# Patient Record
Sex: Female | Born: 1975 | Race: Black or African American | Hispanic: No | Marital: Single | Smoking: Never smoker
Health system: Southern US, Community
[De-identification: ages and names within clinical notes are randomized; demographics above are authoritative.]

## PROBLEM LIST (undated history)

## (undated) ENCOUNTER — Inpatient Hospital Stay (HOSPITAL_COMMUNITY): Payer: Self-pay

## (undated) DIAGNOSIS — D649 Anemia, unspecified: Secondary | ICD-10-CM

## (undated) DIAGNOSIS — Z789 Other specified health status: Secondary | ICD-10-CM

## (undated) DIAGNOSIS — D219 Benign neoplasm of connective and other soft tissue, unspecified: Secondary | ICD-10-CM

## (undated) DIAGNOSIS — I10 Essential (primary) hypertension: Secondary | ICD-10-CM

## (undated) HISTORY — PX: NO PAST SURGERIES: SHX2092

## (undated) HISTORY — DX: Essential (primary) hypertension: I10

## (undated) HISTORY — DX: Benign neoplasm of connective and other soft tissue, unspecified: D21.9

---

## 2000-04-01 ENCOUNTER — Encounter: Payer: Self-pay | Admitting: Internal Medicine

## 2000-04-01 ENCOUNTER — Emergency Department (HOSPITAL_COMMUNITY): Admission: EM | Admit: 2000-04-01 | Discharge: 2000-04-02 | Payer: Self-pay | Admitting: Internal Medicine

## 2000-05-25 ENCOUNTER — Other Ambulatory Visit: Admission: RE | Admit: 2000-05-25 | Discharge: 2000-05-25 | Payer: Self-pay | Admitting: Obstetrics

## 2000-10-06 ENCOUNTER — Inpatient Hospital Stay (HOSPITAL_COMMUNITY): Admission: AD | Admit: 2000-10-06 | Discharge: 2000-10-06 | Payer: Self-pay | Admitting: Obstetrics

## 2000-10-08 ENCOUNTER — Inpatient Hospital Stay (HOSPITAL_COMMUNITY): Admission: AD | Admit: 2000-10-08 | Discharge: 2000-10-11 | Payer: Self-pay | Admitting: Obstetrics & Gynecology

## 2010-09-04 ENCOUNTER — Ambulatory Visit (HOSPITAL_COMMUNITY): Admission: RE | Admit: 2010-09-04 | Discharge: 2010-09-04 | Payer: Self-pay | Admitting: Obstetrics

## 2010-09-04 ENCOUNTER — Encounter: Payer: Self-pay | Admitting: Obstetrics

## 2010-12-29 ENCOUNTER — Encounter: Payer: Self-pay | Admitting: Obstetrics

## 2011-01-26 ENCOUNTER — Inpatient Hospital Stay (HOSPITAL_COMMUNITY)
Admission: AD | Admit: 2011-01-26 | Discharge: 2011-01-28 | DRG: 775 | Disposition: A | Discharge status: Home / Self Care | Payer: Medicaid Other | Source: Ambulatory Visit | Attending: Obstetrics | Admitting: Obstetrics

## 2011-01-26 DIAGNOSIS — O09529 Supervision of elderly multigravida, unspecified trimester: Principal | ICD-10-CM | POA: Diagnosis present

## 2011-01-26 LAB — CBC
HCT: 35.6 % — ABNORMAL LOW (ref 36.0–46.0)
Hemoglobin: 12.1 g/dL (ref 12.0–15.0)
MCH: 29.4 pg (ref 26.0–34.0)
MCHC: 34 g/dL (ref 30.0–36.0)
MCV: 86.4 fL (ref 78.0–100.0)
Platelets: 184 10*3/uL (ref 150–400)
RBC: 4.12 MIL/uL (ref 3.87–5.11)
RDW: 13.6 % (ref 11.5–15.5)
WBC: 10.9 10*3/uL — ABNORMAL HIGH (ref 4.0–10.5)

## 2011-01-26 LAB — RPR: RPR Ser Ql: NONREACTIVE

## 2011-01-27 LAB — CBC
Platelets: 154 10*3/uL (ref 150–400)
RDW: 14.1 % (ref 11.5–15.5)
WBC: 14.5 10*3/uL — ABNORMAL HIGH (ref 4.0–10.5)

## 2011-04-08 ENCOUNTER — Inpatient Hospital Stay (HOSPITAL_COMMUNITY): Admission: AD | Admit: 2011-04-08 | Payer: Self-pay | Source: Home / Self Care | Admitting: Obstetrics

## 2011-08-12 ENCOUNTER — Other Ambulatory Visit: Payer: Self-pay | Admitting: General Practice

## 2011-08-12 ENCOUNTER — Ambulatory Visit
Admission: RE | Admit: 2011-08-12 | Discharge: 2011-08-12 | Disposition: A | Discharge status: Home / Self Care | Payer: Self-pay | Source: Ambulatory Visit | Attending: General Practice | Admitting: General Practice

## 2011-08-12 DIAGNOSIS — M549 Dorsalgia, unspecified: Secondary | ICD-10-CM

## 2011-08-14 ENCOUNTER — Ambulatory Visit: Payer: Self-pay | Attending: General Practice

## 2011-08-14 DIAGNOSIS — M546 Pain in thoracic spine: Secondary | ICD-10-CM | POA: Insufficient documentation

## 2011-08-14 DIAGNOSIS — IMO0001 Reserved for inherently not codable concepts without codable children: Secondary | ICD-10-CM | POA: Insufficient documentation

## 2011-08-14 DIAGNOSIS — M542 Cervicalgia: Secondary | ICD-10-CM | POA: Insufficient documentation

## 2011-08-14 DIAGNOSIS — M2569 Stiffness of other specified joint, not elsewhere classified: Secondary | ICD-10-CM | POA: Insufficient documentation

## 2011-08-14 DIAGNOSIS — M545 Low back pain, unspecified: Secondary | ICD-10-CM | POA: Insufficient documentation

## 2011-08-14 DIAGNOSIS — M79609 Pain in unspecified limb: Secondary | ICD-10-CM | POA: Insufficient documentation

## 2011-12-09 NOTE — L&D Delivery Note (Signed)
Delivery Note At 11:28 AM a viable female was delivered via Vaginal, Spontaneous Delivery (Presentation: Left Occiput Anterior).  APGAR: 8, 9; weight 4 lb 12.9 oz (2180 g).   Placenta status: , .  Cord: 3 vessels with the following complications: Hyperspiraled.  Cord pH: not done  Anesthesia: Epidural  Episiotomy: None Lacerations: None Suture Repair: 2.0 Est. Blood Loss (mL): 300  Mom to postpartum.  Baby to nursery-stable.  Marissa Lowrey A 07/18/2012, 12:34 PM

## 2012-01-01 ENCOUNTER — Inpatient Hospital Stay (HOSPITAL_COMMUNITY): Payer: Self-pay

## 2012-01-01 ENCOUNTER — Encounter (HOSPITAL_COMMUNITY): Payer: Self-pay | Admitting: *Deleted

## 2012-01-01 ENCOUNTER — Inpatient Hospital Stay (HOSPITAL_COMMUNITY)
Admission: AD | Admit: 2012-01-01 | Discharge: 2012-01-01 | Disposition: A | Discharge status: Home / Self Care | Payer: Self-pay | Source: Ambulatory Visit | Attending: Obstetrics & Gynecology | Admitting: Obstetrics & Gynecology

## 2012-01-01 DIAGNOSIS — O209 Hemorrhage in early pregnancy, unspecified: Secondary | ICD-10-CM | POA: Insufficient documentation

## 2012-01-01 HISTORY — DX: Other specified health status: Z78.9

## 2012-01-01 LAB — WET PREP, GENITAL
Trich, Wet Prep: NONE SEEN
Yeast Wet Prep HPF POC: NONE SEEN

## 2012-01-01 LAB — URINALYSIS, ROUTINE W REFLEX MICROSCOPIC
Bilirubin Urine: NEGATIVE
Nitrite: NEGATIVE
Protein, ur: NEGATIVE mg/dL
Specific Gravity, Urine: 1.01 (ref 1.005–1.030)
Urobilinogen, UA: 0.2 mg/dL (ref 0.0–1.0)

## 2012-01-01 LAB — URINE MICROSCOPIC-ADD ON

## 2012-01-01 LAB — CBC
HCT: 34 % — ABNORMAL LOW (ref 36.0–46.0)
Hemoglobin: 11.3 g/dL — ABNORMAL LOW (ref 12.0–15.0)
MCHC: 33.2 g/dL (ref 30.0–36.0)
RBC: 4.44 MIL/uL (ref 3.87–5.11)

## 2012-01-01 LAB — OB RESULTS CONSOLE ANTIBODY SCREEN: Antibody Screen: NEGATIVE

## 2012-01-01 LAB — OB RESULTS CONSOLE RUBELLA ANTIBODY, IGM: Rubella: IMMUNE

## 2012-01-01 LAB — POCT PREGNANCY, URINE: Preg Test, Ur: POSITIVE — AB

## 2012-01-01 NOTE — Progress Notes (Signed)
Pt in c/o moderate amount of dark blood this morning and abdominal cramping.  LMP 10/31/11.

## 2012-01-01 NOTE — Progress Notes (Signed)
+   preg test women's health on 01/08.  Went to the br this morning and saw dark blood.

## 2012-01-01 NOTE — ED Provider Notes (Signed)
History   Pt presents today c/o dark brown vag bleeding that began this am. She states she had a positive preg test in early January and her LMP was in 10/2011. She denies recent intercourse, vag dc, vag irritation. She does report mild lower abd cramping.  No chief complaint on file.  HPI  OB History    Grav Para Term Preterm Abortions TAB SAB Ect Mult Living   1               No past medical history on file.  No past surgical history on file.  No family history on file.  History  Substance Use Topics  . Smoking status: Not on file  . Smokeless tobacco: Not on file  . Alcohol Use: Not on file    Allergies: No Known Allergies  No prescriptions prior to admission    Review of Systems  Constitutional: Negative for fever.  Eyes: Negative for blurred vision and double vision.  Cardiovascular: Negative for chest pain and palpitations.  Gastrointestinal: Positive for abdominal pain. Negative for nausea, vomiting, diarrhea and constipation.  Genitourinary: Negative for dysuria, urgency, frequency and hematuria.  Neurological: Negative for dizziness and headaches.  Psychiatric/Behavioral: Negative for depression and suicidal ideas.   Physical Exam   Blood pressure 116/63, pulse 70, temperature 98.9 F (37.2 C), temperature source Oral, resp. rate 20, height 5\' 3"  (1.6 m), weight 245 lb (111.131 kg), last menstrual period 10/31/2011, SpO2 99.00%.  Physical Exam  Nursing note and vitals reviewed. Constitutional: She is oriented to person, place, and time. She appears well-developed and well-nourished. No distress.  HENT:  Head: Normocephalic and atraumatic.  Eyes: EOM are normal. Pupils are equal, round, and reactive to light.  GI: Soft. She exhibits no distension and no mass. There is no tenderness. There is no rebound and no guarding.  Genitourinary: There is bleeding around the vagina. No vaginal discharge found.       Cervix Lg/closed. Minimal amount of dark blood  present in the vag vault. No adnexal masses.  Neurological: She is alert and oriented to person, place, and time.  Skin: Skin is warm and dry. She is not diaphoretic.  Psychiatric: She has a normal mood and affect. Her behavior is normal. Judgment and thought content normal.    MAU Course  Procedures  Wet prep and GC/Chlamydia cultures done.  Results for orders placed during the hospital encounter of 01/01/12 (from the past 72 hour(s))  URINALYSIS, ROUTINE W REFLEX MICROSCOPIC     Status: Abnormal   Collection Time   01/01/12 12:00 PM      Component Value Range Comment   Color, Urine YELLOW  YELLOW     APPearance CLEAR  CLEAR     Specific Gravity, Urine 1.010  1.005 - 1.030     pH 8.0  5.0 - 8.0     Glucose, UA NEGATIVE  NEGATIVE (mg/dL)    Hgb urine dipstick MODERATE (*) NEGATIVE     Bilirubin Urine NEGATIVE  NEGATIVE     Ketones, ur NEGATIVE  NEGATIVE (mg/dL)    Protein, ur NEGATIVE  NEGATIVE (mg/dL)    Urobilinogen, UA 0.2  0.0 - 1.0 (mg/dL)    Nitrite NEGATIVE  NEGATIVE     Leukocytes, UA NEGATIVE  NEGATIVE    URINE MICROSCOPIC-ADD ON     Status: Abnormal   Collection Time   01/01/12 12:00 PM      Component Value Range Comment   Squamous Epithelial / LPF FEW (*)  RARE     RBC / HPF 3-6  <3 (RBC/hpf)   POCT PREGNANCY, URINE     Status: Abnormal   Collection Time   01/01/12 12:07 PM      Component Value Range Comment   Preg Test, Ur POSITIVE (*) NEGATIVE    CBC     Status: Abnormal   Collection Time   01/01/12 12:18 PM      Component Value Range Comment   WBC 9.3  4.0 - 10.5 (K/uL)    RBC 4.44  3.87 - 5.11 (MIL/uL)    Hemoglobin 11.3 (*) 12.0 - 15.0 (g/dL)    HCT 16.1 (*) 09.6 - 46.0 (%)    MCV 76.6 (*) 78.0 - 100.0 (fL)    MCH 25.5 (*) 26.0 - 34.0 (pg)    MCHC 33.2  30.0 - 36.0 (g/dL)    RDW 04.5 (*) 40.9 - 15.5 (%)    Platelets 221  150 - 400 (K/uL)   ABO/RH     Status: Normal   Collection Time   01/01/12 12:18 PM      Component Value Range Comment   ABO/RH(D) A  POS     WET PREP, GENITAL     Status: Abnormal   Collection Time   01/01/12 12:25 PM      Component Value Range Comment   Yeast, Wet Prep NONE SEEN  NONE SEEN     Trich, Wet Prep NONE SEEN  NONE SEEN     Clue Cells, Wet Prep NONE SEEN  NONE SEEN     WBC, Wet Prep HPF POC FEW (*) NONE SEEN  MANY BACTERIA SEEN   US shows single living IUP with EGA of 8.6wks and an EDC of 08/06/12. Focal fibroids noted.  Assessment and Plan  Bleeding in preg: discussed with pt at length. Advised no intercourse. She will f/u with her OB provider. Discussed diet, activity, risks, and precautions.  Clinton Gallant. Rice III, DrHSc, MPAS, PA-C  01/01/2012, 12:16 PM   Henrietta Hoover, PA 01/01/12 1336

## 2012-01-02 LAB — GC/CHLAMYDIA PROBE AMP, GENITAL: Chlamydia, DNA Probe: NEGATIVE

## 2012-02-23 ENCOUNTER — Other Ambulatory Visit (HOSPITAL_COMMUNITY): Payer: Self-pay | Admitting: Obstetrics

## 2012-02-23 DIAGNOSIS — O09529 Supervision of elderly multigravida, unspecified trimester: Secondary | ICD-10-CM

## 2012-02-23 DIAGNOSIS — Z3689 Encounter for other specified antenatal screening: Secondary | ICD-10-CM

## 2012-02-25 ENCOUNTER — Encounter (HOSPITAL_COMMUNITY): Payer: Self-pay | Admitting: MS"

## 2012-02-25 ENCOUNTER — Encounter (HOSPITAL_COMMUNITY): Payer: Self-pay | Admitting: Obstetrics

## 2012-03-02 ENCOUNTER — Ambulatory Visit (HOSPITAL_COMMUNITY)
Admission: RE | Admit: 2012-03-02 | Discharge: 2012-03-02 | Disposition: A | Discharge status: Home / Self Care | Payer: Self-pay | Source: Ambulatory Visit | Attending: Obstetrics | Admitting: Obstetrics

## 2012-03-02 ENCOUNTER — Other Ambulatory Visit: Payer: Self-pay

## 2012-03-02 VITALS — BP 111/46 | HR 85 | Wt 255.0 lb

## 2012-03-02 DIAGNOSIS — O09529 Supervision of elderly multigravida, unspecified trimester: Secondary | ICD-10-CM | POA: Insufficient documentation

## 2012-03-02 DIAGNOSIS — Z3689 Encounter for other specified antenatal screening: Secondary | ICD-10-CM

## 2012-03-02 DIAGNOSIS — E669 Obesity, unspecified: Secondary | ICD-10-CM | POA: Insufficient documentation

## 2012-03-02 DIAGNOSIS — Z363 Encounter for antenatal screening for malformations: Secondary | ICD-10-CM | POA: Insufficient documentation

## 2012-03-02 DIAGNOSIS — O3410 Maternal care for benign tumor of corpus uteri, unspecified trimester: Secondary | ICD-10-CM | POA: Insufficient documentation

## 2012-03-02 DIAGNOSIS — Z1389 Encounter for screening for other disorder: Secondary | ICD-10-CM | POA: Insufficient documentation

## 2012-03-02 DIAGNOSIS — O358XX0 Maternal care for other (suspected) fetal abnormality and damage, not applicable or unspecified: Secondary | ICD-10-CM | POA: Insufficient documentation

## 2012-03-02 DIAGNOSIS — O99210 Obesity complicating pregnancy, unspecified trimester: Secondary | ICD-10-CM | POA: Insufficient documentation

## 2012-03-02 LAB — QUAD SCREEN FOR MFM

## 2012-03-02 NOTE — Progress Notes (Signed)
Genetic Counseling  High-Risk Gestation Note  Appointment Date:  03/02/2012 Referred By: Kathreen Cosier, MD Date of Birth:  1976/07/12 Partner:  Natalie Cervantes    Pregnancy History: [id number] Estimated Date of Delivery: 08/06/12 Estimated Gestational Age: [redacted]w[redacted]d Attending: Rica Koyanagi, MD  Natalie Cervantes and her partner, [name], were seen for genetic counseling because of a maternal age of 36 y.o.Marland Kitchen     They were counseled regarding maternal age and the association with risk for chromosome conditions due to nondisjunction with aging of the ova.   We reviewed chromosomes, nondisjunction, and the associated 1 in 54 risk for fetal aneuploidy at [redacted]w[redacted]d related to a maternal age of 36 y.o. at delivery.  They were counseled that the risk for aneuploidy decreases as gestational age increases, accounting for those pregnancies which spontaneously abort.  We specifically discussed Down syndrome (trisomy 21), trisomies 13 and 47, including the common features and prognoses of each.   We reviewed available screening and diagnostic options.  Regarding screening tests, we discussed the options of Quad screen and ultrasound.  They understand that screening tests are used to modify a patient's a priori risk for aneuploidy, typically based on age.  This estimate provides a pregnancy specific risk assessment. We discussed another type of screening test, noninvasive prenatal testing (NIPT), which utilizes cell free fetal DNA found in the maternal circulation. This test is not diagnostic for chromosome conditions, but can provide information regarding the presence or absence of extra fetal DNA for chromosomes 13, 18 and 21. Thus, it would not identify or rule out all fetal aneuploidy. The reported detection rate is greater than 99% for Trisomy 21, greater than 97% for Trisomy 18, and is approximately 80% (8 out of 10) for Trisomy 13. The false positive rate is reported to be less than 1% for any of  these conditions. We also reviewed the availability of diagnostic option of amniocentesis.  We discussed the risks, limitations, and benefits of each.  After reviewing these options, Natalie Cervantes elected to have Quad screening and ultrasound, but declined amniocentesis and cell free fetal DNA testing ([facility]).   She understands that ultrasound cannot rule out all birth defects or genetic syndromes. The patient was advised of this limitation and states she still does not want diagnostic testing at this time.  However, they were counseled that 50-80% of fetuses with Down syndrome and up to 90% of fetuses with trisomies 13 and 18, when well visualized, have detectable anomalies or soft markers by ultrasound. Ultrasound was performed at the time of today's visit. Complete ultrasound results reported separately.   Both family histories were updated since the patient's 2011 genetic counseling visit. The couple reported that their 23 months old daughter is healthy. The family history was noncontributory for updates regarding birth defects, mental retardation, and known genetic conditions. Without further information regarding the provided family history, an accurate genetic risk cannot be calculated. Further genetic counseling is warranted if more information is obtained.  Ms. Natalie Cervantes denied exposure to environmental toxins or chemical agents. She denied the use of alcohol, tobacco or street drugs. She denied significant viral illnesses during the course of her pregnancy. Her medical and surgical histories were noncontributory.   I counseled this couple regarding the above risks and available options.  The approximate face-to-face time with the genetic counselor was 20 minutes.  Natalie Plowman, MS,  Certified [employer] 03/02/2012

## 2012-03-02 NOTE — Progress Notes (Signed)
Obstetric ultrasound performed today.  Reassuring findings noted.   Patient received genetic counseling (see separate report).  She elected to proceed with Quad Screen.  This was drawn today.   Follow up scheduled in 6 weeks

## 2012-03-05 ENCOUNTER — Telehealth (HOSPITAL_COMMUNITY): Payer: Self-pay | Admitting: MS"

## 2012-03-05 NOTE — Telephone Encounter (Signed)
Called Ms. Natalie Cervantes to discuss results of Quad screening. Reviewed that these are screen negative. This screen reduced the chance for Down syndrome (1 in 1,073), Trisomy 18 (less than 1 in 10,000), and neural tube defects. We reviewed that this screen does not diagnose or rule out these conditions. All questions were answered to patient's satisfaction.   Quinn Plowman, MS Patent attorney

## 2012-04-13 ENCOUNTER — Ambulatory Visit (HOSPITAL_COMMUNITY)
Admission: RE | Admit: 2012-04-13 | Discharge: 2012-04-13 | Disposition: A | Discharge status: Home / Self Care | Payer: Self-pay | Source: Ambulatory Visit | Attending: Obstetrics | Admitting: Obstetrics

## 2012-04-13 DIAGNOSIS — E669 Obesity, unspecified: Secondary | ICD-10-CM | POA: Insufficient documentation

## 2012-04-13 DIAGNOSIS — O3410 Maternal care for benign tumor of corpus uteri, unspecified trimester: Secondary | ICD-10-CM | POA: Insufficient documentation

## 2012-04-13 DIAGNOSIS — Z363 Encounter for antenatal screening for malformations: Secondary | ICD-10-CM | POA: Insufficient documentation

## 2012-04-13 DIAGNOSIS — O358XX0 Maternal care for other (suspected) fetal abnormality and damage, not applicable or unspecified: Secondary | ICD-10-CM | POA: Insufficient documentation

## 2012-04-13 DIAGNOSIS — Z3689 Encounter for other specified antenatal screening: Secondary | ICD-10-CM

## 2012-04-13 DIAGNOSIS — O09529 Supervision of elderly multigravida, unspecified trimester: Secondary | ICD-10-CM | POA: Insufficient documentation

## 2012-04-13 DIAGNOSIS — Z1389 Encounter for screening for other disorder: Secondary | ICD-10-CM | POA: Insufficient documentation

## 2012-07-18 ENCOUNTER — Encounter (HOSPITAL_COMMUNITY): Payer: Self-pay | Admitting: *Deleted

## 2012-07-18 ENCOUNTER — Inpatient Hospital Stay (HOSPITAL_COMMUNITY): Payer: Medicaid Other | Admitting: Anesthesiology

## 2012-07-18 ENCOUNTER — Encounter (HOSPITAL_COMMUNITY): Payer: Self-pay | Admitting: Anesthesiology

## 2012-07-18 ENCOUNTER — Inpatient Hospital Stay (HOSPITAL_COMMUNITY)
Admission: AD | Admit: 2012-07-18 | Discharge: 2012-07-20 | DRG: 775 | Disposition: A | Discharge status: Home / Self Care | Payer: Medicaid Other | Source: Ambulatory Visit | Attending: Obstetrics | Admitting: Obstetrics

## 2012-07-18 DIAGNOSIS — O09529 Supervision of elderly multigravida, unspecified trimester: Principal | ICD-10-CM | POA: Diagnosis present

## 2012-07-18 LAB — CBC
HCT: 35 % — ABNORMAL LOW (ref 36.0–46.0)
MCH: 28.5 pg (ref 26.0–34.0)
MCHC: 34 g/dL (ref 30.0–36.0)
MCV: 83.9 fL (ref 78.0–100.0)
RDW: 14.6 % (ref 11.5–15.5)

## 2012-07-18 MED ORDER — NITROGLYCERIN 1 MG/10 ML FOR IR/CATH LAB: 10.0000 mL | INTRA_ARTERIAL | Status: DC

## 2012-07-18 MED ORDER — BUTORPHANOL TARTRATE 1 MG/ML IJ SOLN
1.0000 mg | INTRAMUSCULAR | Status: DC | PRN
  Filled 2012-07-18: qty 1

## 2012-07-18 MED ORDER — LACTATED RINGERS IV SOLN
500.0000 mL | INTRAVENOUS | Status: DC | PRN
  Administered 2012-07-18: 500 mL via INTRAVENOUS

## 2012-07-18 MED ORDER — OXYTOCIN 40 UNITS IN LACTATED RINGERS INFUSION - SIMPLE MED
1.0000 m[IU]/min | INTRAVENOUS | Status: DC
  Administered 2012-07-18: 1 m[IU]/min via INTRAVENOUS
  Filled 2012-07-18: qty 1000

## 2012-07-18 MED ORDER — OXYCODONE-ACETAMINOPHEN 5-325 MG PO TABS: 1.0000 | ORAL_TABLET | ORAL | Status: DC | PRN

## 2012-07-18 MED ORDER — BENZOCAINE-MENTHOL 20-0.5 % EX AERO
1.0000 "application " | INHALATION_SPRAY | CUTANEOUS | Status: DC | PRN
  Filled 2012-07-18: qty 56

## 2012-07-18 MED ORDER — LACTATED RINGERS IV SOLN
INTRAVENOUS | Status: DC
  Administered 2012-07-18: 07:00:00 via INTRAVENOUS

## 2012-07-18 MED ORDER — CITRIC ACID-SODIUM CITRATE 334-500 MG/5ML PO SOLN: 30.0000 mL | ORAL | Status: DC | PRN

## 2012-07-18 MED ORDER — SODIUM BICARBONATE 8.4 % IV SOLN
INTRAVENOUS | Status: DC | PRN
  Administered 2012-07-18: 4 mL via EPIDURAL

## 2012-07-18 MED ORDER — TETANUS-DIPHTH-ACELL PERTUSSIS 5-2.5-18.5 LF-MCG/0.5 IM SUSP
0.5000 mL | Freq: Once | INTRAMUSCULAR | Status: AC
  Administered 2012-07-19: 0.5 mL via INTRAMUSCULAR
  Filled 2012-07-18: qty 0.5

## 2012-07-18 MED ORDER — LANOLIN HYDROUS EX OINT: TOPICAL_OINTMENT | CUTANEOUS | Status: DC | PRN

## 2012-07-18 MED ORDER — DIBUCAINE 1 % RE OINT: 1.0000 "application " | TOPICAL_OINTMENT | RECTAL | Status: DC | PRN

## 2012-07-18 MED ORDER — EPHEDRINE 5 MG/ML INJ
10.0000 mg | INTRAVENOUS | Status: DC | PRN
  Administered 2012-07-18: 10 mg via INTRAVENOUS
  Filled 2012-07-18 (×2): qty 4

## 2012-07-18 MED ORDER — WITCH HAZEL-GLYCERIN EX PADS: 1.0000 "application " | MEDICATED_PAD | CUTANEOUS | Status: DC | PRN

## 2012-07-18 MED ORDER — IBUPROFEN 600 MG PO TABS
600.0000 mg | ORAL_TABLET | Freq: Four times a day (QID) | ORAL | Status: DC | PRN
  Filled 2012-07-18 (×6): qty 1

## 2012-07-18 MED ORDER — PHENYLEPHRINE 40 MCG/ML (10ML) SYRINGE FOR IV PUSH (FOR BLOOD PRESSURE SUPPORT)
80.0000 ug | PREFILLED_SYRINGE | INTRAVENOUS | Status: DC | PRN
  Administered 2012-07-18: 80 ug via INTRAVENOUS
  Filled 2012-07-18: qty 20
  Filled 2012-07-18: qty 5
  Filled 2012-07-18: qty 20

## 2012-07-18 MED ORDER — FLEET ENEMA 7-19 GM/118ML RE ENEM: 1.0000 | ENEMA | RECTAL | Status: DC | PRN

## 2012-07-18 MED ORDER — PENICILLIN G POTASSIUM 5000000 UNITS IJ SOLR
5.0000 10*6.[IU] | Freq: Once | INTRAVENOUS | Status: AC
  Administered 2012-07-18: 5 10*6.[IU] via INTRAVENOUS
  Filled 2012-07-18: qty 5

## 2012-07-18 MED ORDER — PENICILLIN G POTASSIUM 5000000 UNITS IJ SOLR
2.5000 10*6.[IU] | INTRAVENOUS | Status: DC
  Filled 2012-07-18 (×5): qty 2.5

## 2012-07-18 MED ORDER — SODIUM BICARBONATE 8.4 % IV SOLN
INTRAVENOUS | Status: DC | PRN
  Administered 2012-07-18: 5 mL via EPIDURAL

## 2012-07-18 MED ORDER — DIPHENHYDRAMINE HCL 25 MG PO CAPS: 25.0000 mg | ORAL_CAPSULE | Freq: Four times a day (QID) | ORAL | Status: DC | PRN

## 2012-07-18 MED ORDER — IBUPROFEN 600 MG PO TABS
600.0000 mg | ORAL_TABLET | Freq: Four times a day (QID) | ORAL | Status: DC
  Administered 2012-07-18 – 2012-07-20 (×8): 600 mg via ORAL
  Filled 2012-07-18 (×2): qty 1

## 2012-07-18 MED ORDER — PHENYLEPHRINE 40 MCG/ML (10ML) SYRINGE FOR IV PUSH (FOR BLOOD PRESSURE SUPPORT): 80.0000 ug | PREFILLED_SYRINGE | INTRAVENOUS | Status: DC | PRN

## 2012-07-18 MED ORDER — FERROUS SULFATE 325 (65 FE) MG PO TABS
325.0000 mg | ORAL_TABLET | Freq: Two times a day (BID) | ORAL | Status: DC
  Administered 2012-07-19 – 2012-07-20 (×2): 325 mg via ORAL
  Filled 2012-07-18 (×2): qty 1

## 2012-07-18 MED ORDER — NITROGLYCERIN 5 MG/ML IV SOLN
0.5000 mg | INTRAVENOUS | Status: DC | PRN
  Filled 2012-07-18: qty 0.1

## 2012-07-18 MED ORDER — SIMETHICONE 80 MG PO CHEW: 80.0000 mg | CHEWABLE_TABLET | ORAL | Status: DC | PRN

## 2012-07-18 MED ORDER — SENNOSIDES-DOCUSATE SODIUM 8.6-50 MG PO TABS
2.0000 | ORAL_TABLET | Freq: Every day | ORAL | Status: DC
  Administered 2012-07-18 – 2012-07-19 (×2): 2 via ORAL

## 2012-07-18 MED ORDER — ZOLPIDEM TARTRATE 5 MG PO TABS: 5.0000 mg | ORAL_TABLET | Freq: Every evening | ORAL | Status: DC | PRN

## 2012-07-18 MED ORDER — ONDANSETRON HCL 4 MG/2ML IJ SOLN: 4.0000 mg | Freq: Four times a day (QID) | INTRAMUSCULAR | Status: DC | PRN

## 2012-07-18 MED ORDER — ONDANSETRON HCL 4 MG PO TABS: 4.0000 mg | ORAL_TABLET | ORAL | Status: DC | PRN

## 2012-07-18 MED ORDER — LIDOCAINE HCL (PF) 1 % IJ SOLN: 30.0000 mL | INTRAMUSCULAR | Status: DC | PRN

## 2012-07-18 MED ORDER — FENTANYL 2.5 MCG/ML BUPIVACAINE 1/10 % EPIDURAL INFUSION (WH - ANES)
INTRAMUSCULAR | Status: DC | PRN
  Administered 2012-07-18: 14 mL/h via EPIDURAL

## 2012-07-18 MED ORDER — ACETAMINOPHEN 325 MG PO TABS: 650.0000 mg | ORAL_TABLET | ORAL | Status: DC | PRN

## 2012-07-18 MED ORDER — ONDANSETRON HCL 4 MG/2ML IJ SOLN: 4.0000 mg | INTRAMUSCULAR | Status: DC | PRN

## 2012-07-18 MED ORDER — FENTANYL 2.5 MCG/ML BUPIVACAINE 1/10 % EPIDURAL INFUSION (WH - ANES)
14.0000 mL/h | INTRAMUSCULAR | Status: DC
  Filled 2012-07-18: qty 60

## 2012-07-18 MED ORDER — OXYCODONE-ACETAMINOPHEN 5-325 MG PO TABS
1.0000 | ORAL_TABLET | ORAL | Status: DC | PRN
  Administered 2012-07-19: 1 via ORAL
  Filled 2012-07-18: qty 1

## 2012-07-18 MED ORDER — PRENATAL MULTIVITAMIN CH
1.0000 | ORAL_TABLET | Freq: Every day | ORAL | Status: DC
  Administered 2012-07-18 – 2012-07-20 (×3): 1 via ORAL
  Filled 2012-07-18 (×3): qty 1

## 2012-07-18 MED ORDER — LACTATED RINGERS IV SOLN: 500.0000 mL | Freq: Once | INTRAVENOUS | Status: DC

## 2012-07-18 MED ORDER — OXYTOCIN BOLUS FROM INFUSION
250.0000 mL | Freq: Once | INTRAVENOUS | Status: DC
  Filled 2012-07-18: qty 500

## 2012-07-18 MED ORDER — DIPHENHYDRAMINE HCL 50 MG/ML IJ SOLN: 12.5000 mg | INTRAMUSCULAR | Status: DC | PRN

## 2012-07-18 MED ORDER — OXYTOCIN 40 UNITS IN LACTATED RINGERS INFUSION - SIMPLE MED
62.5000 mL/h | Freq: Once | INTRAVENOUS | Status: AC
  Administered 2012-07-18: 62.5 mL/h via INTRAVENOUS

## 2012-07-18 MED ORDER — EPHEDRINE 5 MG/ML INJ: 10.0000 mg | INTRAVENOUS | Status: DC | PRN

## 2012-07-18 MED ORDER — TERBUTALINE SULFATE 1 MG/ML IJ SOLN: 0.2500 mg | Freq: Once | INTRAMUSCULAR | Status: DC | PRN

## 2012-07-18 NOTE — Progress Notes (Signed)
Report called to [facility] in [place]. Pt to [place] via w/c from Triage.

## 2012-07-18 NOTE — MAU Note (Signed)
My water broke at 0430. Having contractions

## 2012-07-18 NOTE — H&P (Signed)
This is Dr. Francoise Ceo dictating the history and physical on  Natalie Cervantes she's a 36 year old gravida 3 para 202 at 79 weeks and 3 days her EDC is 08/06/2012 her membranes ruptured spontaneously at 5 AM today fluid clear her GBS is unknown so she was given penicillin regiment she's 2-3 cm vertex -2 cervix 70% and she's on low-dose Pitocin Past medical history negative Past surgical history negative Social history negative System review noncontributory Physical exam well-developed female in no distress HEENT negative Breasts negative Heart regular rhythm no murmurs no gallops Lungs clear Abdomen 36 week size Pelvic as described above Extremities negative and

## 2012-07-18 NOTE — Anesthesia Preprocedure Evaluation (Addendum)
Anesthesia Evaluation  Patient identified by MRN, date of birth, ID band Patient awake    Reviewed: Allergy & Precautions, H&P , Patient's Chart, lab work & pertinent test results  Airway Mallampati: III  TM Distance: >3 FB Neck ROM: full    Dental  (+) Teeth Intact   Pulmonary  breath sounds clear to auscultation        Cardiovascular Rhythm:regular Rate:Normal     Neuro/Psych    GI/Hepatic   Endo/Other  Morbid obesity  Renal/GU      Musculoskeletal   Abdominal   Peds  Hematology   Anesthesia Other Findings       Reproductive/Obstetrics (+) Pregnancy                             Anesthesia Physical Anesthesia Plan  ASA: III  Anesthesia Plan: Epidural   Post-op Pain Management:    Induction:   Airway Management Planned:   Additional Equipment:   Intra-op Plan:   Post-operative Plan:   Informed Consent: I have reviewed the patients History and Physical, chart, labs and discussed the procedure including the risks, benefits and alternatives for the proposed anesthesia with the patient or authorized representative who has indicated his/her understanding and acceptance.   Dental Advisory Given  Plan Discussed with:   Anesthesia Plan Comments: (Labs checked- platelets confirmed with RN in room. Fetal heart tracing, per RN, reported to be stable enough for sitting procedure. Discussed epidural, and patient consents to the procedure:  included risk of possible headache,backache, failed block, allergic reaction, and nerve injury. This patient was asked if she had any questions or concerns before the procedure started.)        Anesthesia Quick Evaluation  

## 2012-07-18 NOTE — Anesthesia Procedure Notes (Signed)
Epidural Patient location during procedure: OB  Preanesthetic Checklist Completed: patient identified, site marked, surgical consent, pre-op evaluation, timeout performed, IV checked, risks and benefits discussed and monitors and equipment checked  Epidural Patient position: sitting Prep: site prepped and draped and DuraPrep Patient monitoring: continuous pulse ox and blood pressure Approach: midline Injection technique: LOR air  Needle:  Needle type: Tuohy  Needle gauge: 17 G Needle length: 9 cm Needle insertion depth: 8 cm Catheter type: closed end flexible Catheter size: 19 Gauge Catheter at skin depth: 15 cm Test dose: negative  Assessment Events: blood not aspirated, injection not painful, no injection resistance, negative IV test and no paresthesia  Additional Notes Dosing of Epidural:  1st dose, through needle ............................................. epi 1:200K + Xylocaine 40 mg  2nd dose, through catheter, after waiting 3 minutes.....epi 1:200K + Xylocaine 40 mg  3rd dose, through catheter after waiting 3 minutes .............................Marcaine   4mg   ( mg Marcaine are expressed as equivilent  cc's medication removed from the 0.1%Bupiv / fentanyl syringe from L&D pump)  ( 2% Xylo charted as a single dose in Epic Meds for ease of charting; actual dosing was fractionated as above, for saftey's sake)  As each dose occurred, patient was free of IV sx; and patient exhibited no evidence of SA injection.  Patient is more comfortable after epidural dosed. Please see RN's note for documentation of vital signs,and FHR which are stable.  Patient reminded not to try to ambulate with numb legs, and that an RN must be present the 1st time she attempts to get up.    

## 2012-07-19 LAB — CBC
HCT: 34.7 % — ABNORMAL LOW (ref 36.0–46.0)
Hemoglobin: 11.5 g/dL — ABNORMAL LOW (ref 12.0–15.0)
WBC: 11.7 10*3/uL — ABNORMAL HIGH (ref 4.0–10.5)

## 2012-07-19 NOTE — Progress Notes (Signed)
UR chart review completed.  

## 2012-07-19 NOTE — Progress Notes (Signed)
Patient ID: Natalie Cervantes, female   DOB: 11-10-1976, 36 y.o.   MRN: [id number] Postpartum day one Vital signs normal Fundus firm Legs negative Lochia negative doing well

## 2012-07-19 NOTE — Anesthesia Postprocedure Evaluation (Signed)
  Anesthesia Post Note  Patient: Natalie Cervantes  Procedure(s) Performed: * No procedures listed *  Anesthesia type: Epidural  Patient location: Mother/Baby  Post pain: Pain level controlled  Post assessment: Post-op Vital signs reviewed  Last Vitals:  Filed Vitals:   07/19/12 0555  BP: 107/73  Pulse: 74  Temp: 36.7 C  Resp: 20    Post vital signs: Reviewed  Level of consciousness:alert  Complications: No apparent anesthesia complications

## 2012-07-20 NOTE — Discharge Summary (Signed)
Obstetric Discharge Summary Reason for Admission: onset of labor Prenatal Procedures: none Intrapartum Procedures: spontaneous vaginal delivery Postpartum Procedures: none Complications-Operative and Postpartum: none Hemoglobin  Date Value Range Status  07/19/2012 11.5* 12.0 - 15.0 g/dL Final     HCT  Date Value Range Status  07/19/2012 34.7* 36.0 - 46.0 % Final    Physical Exam:  General: alert Lochia: appropriate Uterine Fundus: firm Incision: healing well DVT Evaluation: No evidence of DVT seen on physical exam.  Discharge Diagnoses: Term Pregnancy-delivered  Discharge Information: Date: 07/20/2012 Activity: pelvic rest Diet: routine Medications: Percocet Condition: stable Instructions: refer to practice specific booklet Discharge to: home Follow-up Information    Follow up with MARSHALL,BERNARD A, MD. Call in 6 weeks.   Contact information:   [address] [place] [address] [phone]          Newborn Data: Live born female  Birth Weight: 4 lb 12.9 oz (2180 g) APGAR: 8, 9  Home with mother.  MARSHALL,BERNARD A 07/20/2012, 7:48 AM

## 2012-07-20 NOTE — Discharge Summary (Signed)
  Discharge instructions   You can wash your hair  Shower  Eat what you want  Drink what you want  See me in 6 weeks  Your ankles are going to swell more in the next 2 weeks than when pregnant  No sex for 6 weeks   MARSHALL,BERNARD A, MD 07/20/2012

## 2013-06-24 IMAGING — US US OB DETAIL+14 WK
1 series · 14 of 28 positions shown · non-contrast
Comparison: none

[Series 1: us ob detail+14 wk · 0.19mm/px · 14 of 59 slices shown]
[im 3/59]
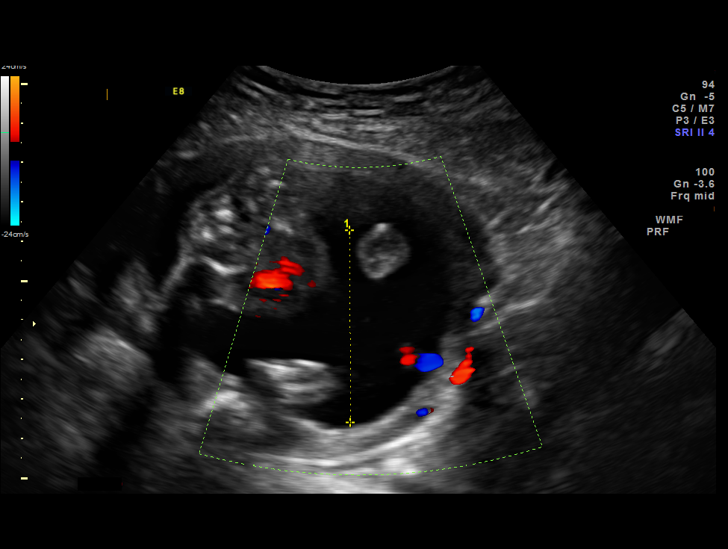
[im 7/59]
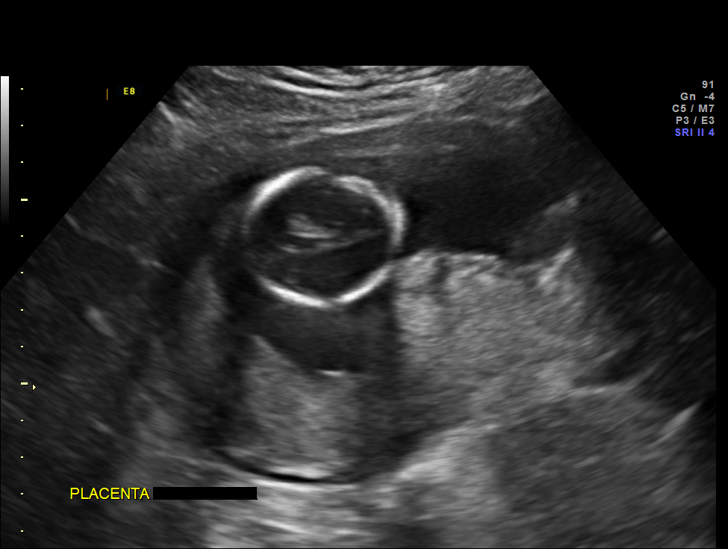
[im 11/59]
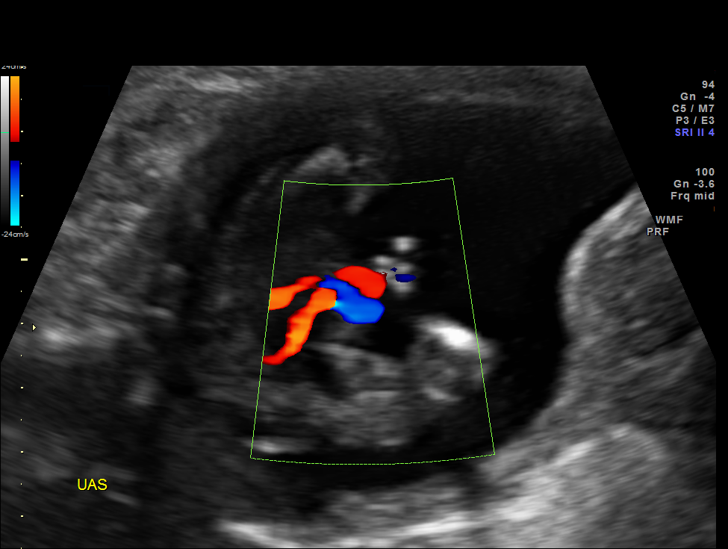
[im 16/59]
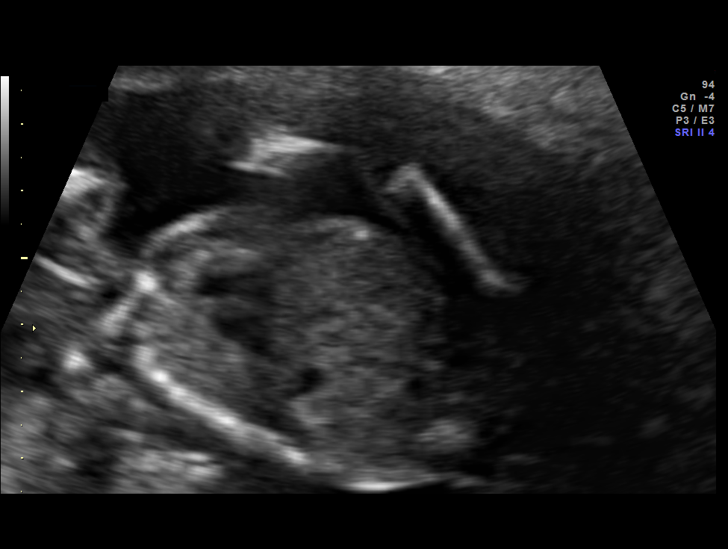
[im 20/59]
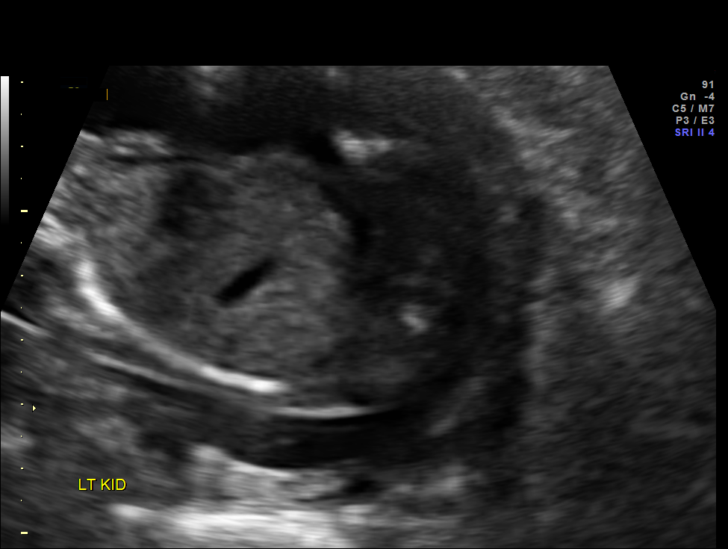
[im 24/59]
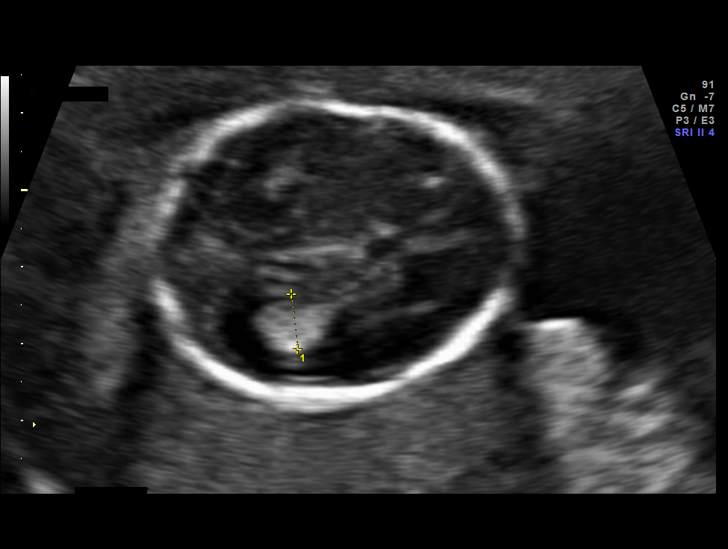
[im 28/59]
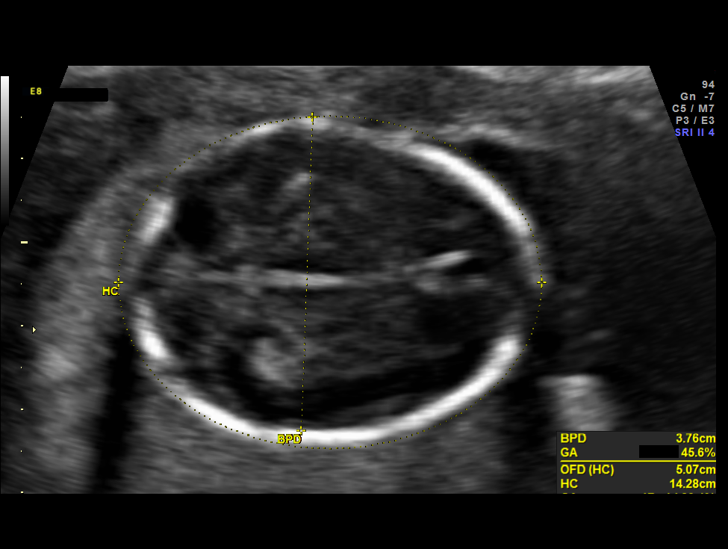
[im 33/59]
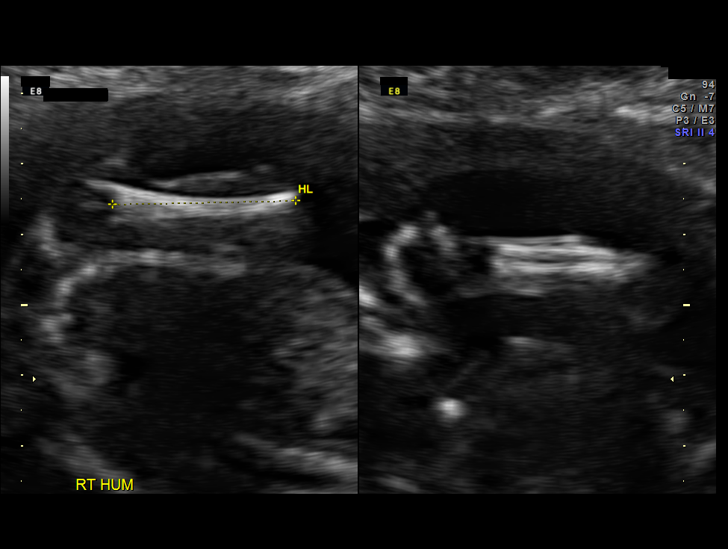
[im 37/59]
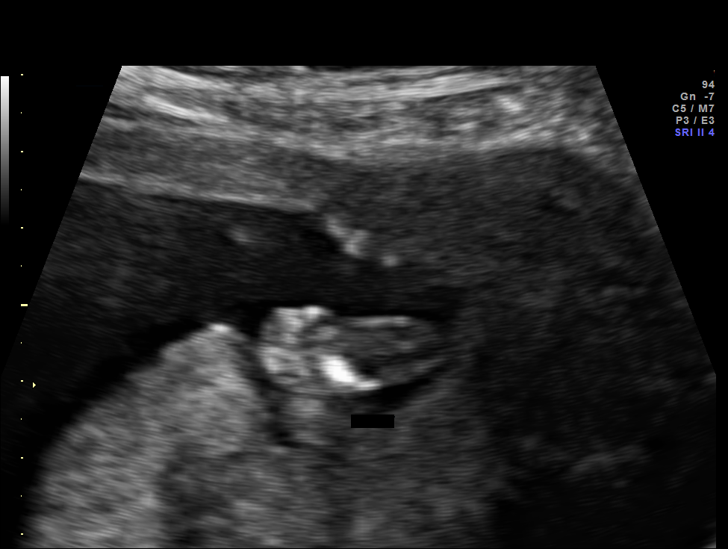
[im 41/59]
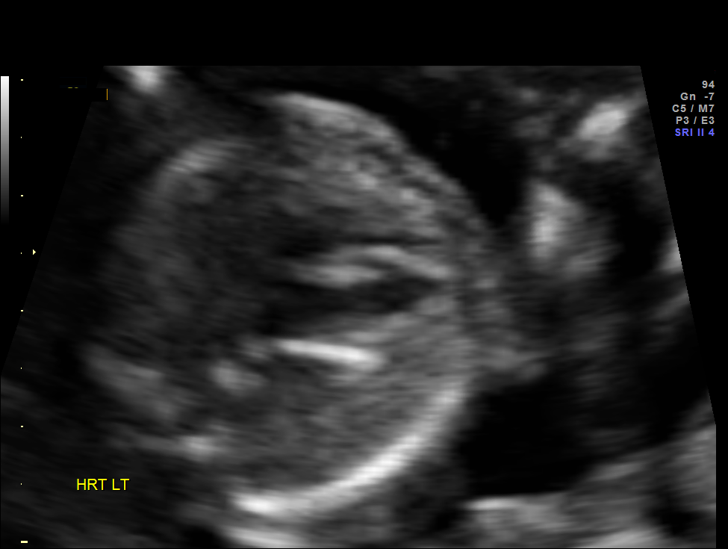
[im 46/59]
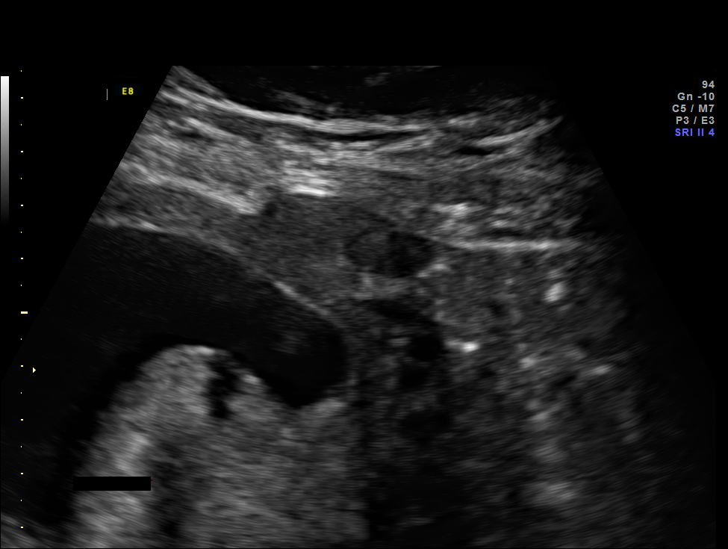
[im 50/59]
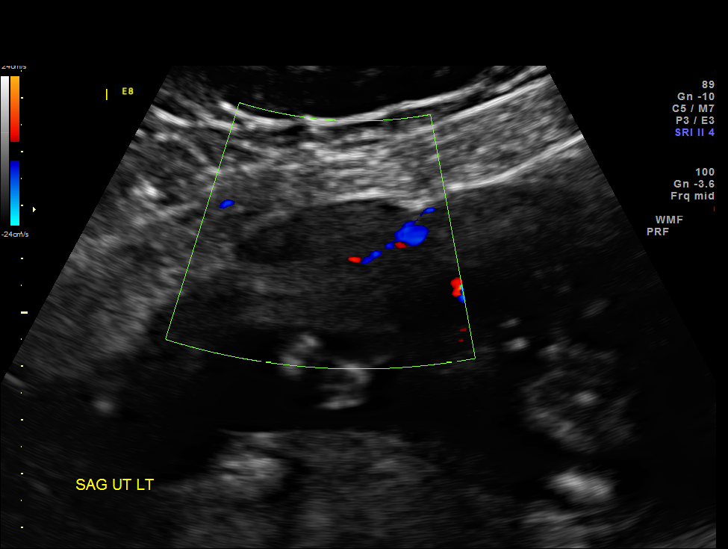
[im 54/59]
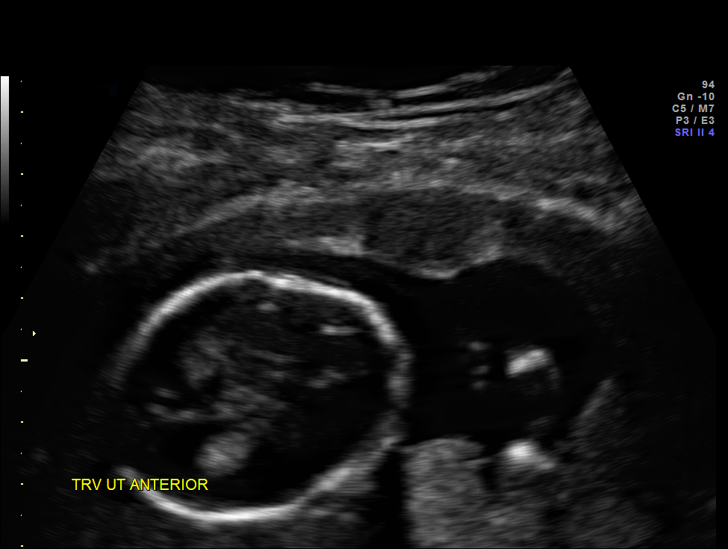
[im 59/59]
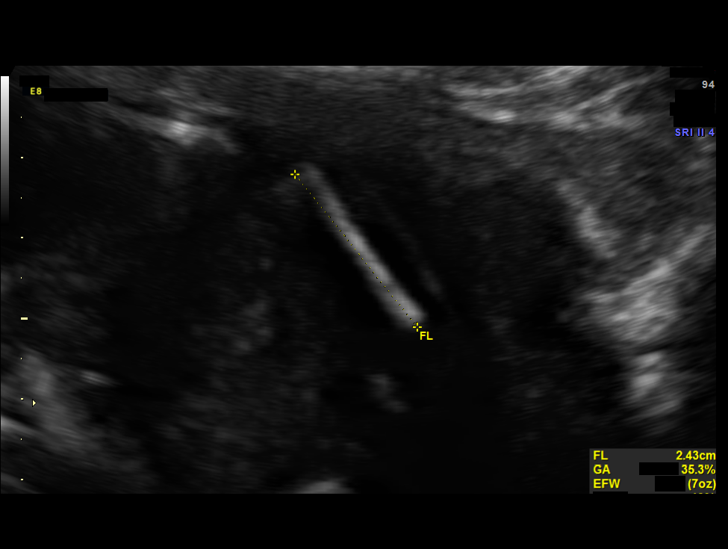

[14 of 28 positions shown; findings below may reference images not displayed]

Canned report from images found in remote index.

Refer to host system for actual result text.

## 2013-08-05 IMAGING — US US OB FOLLOW-UP
1 series · 12 of 28 positions shown · non-contrast
Comparison: none

[Series 1: us ob follow-up · 0.16mm/px · 12 of 61 slices shown]
[im 3/61]
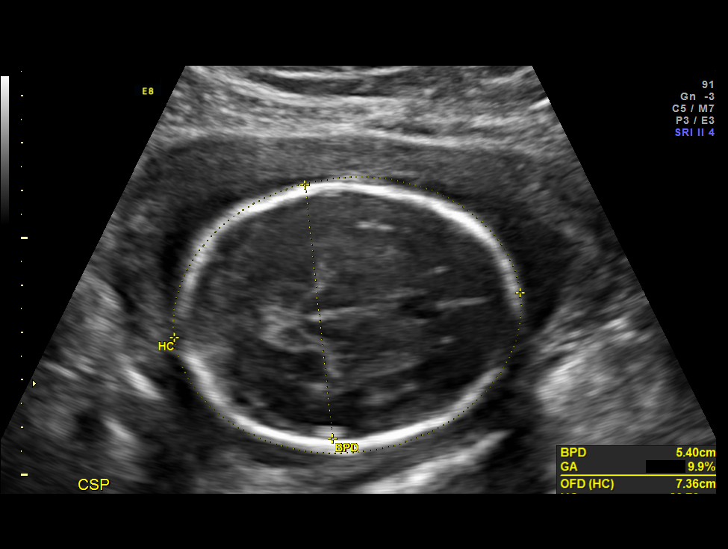
[im 7/61]
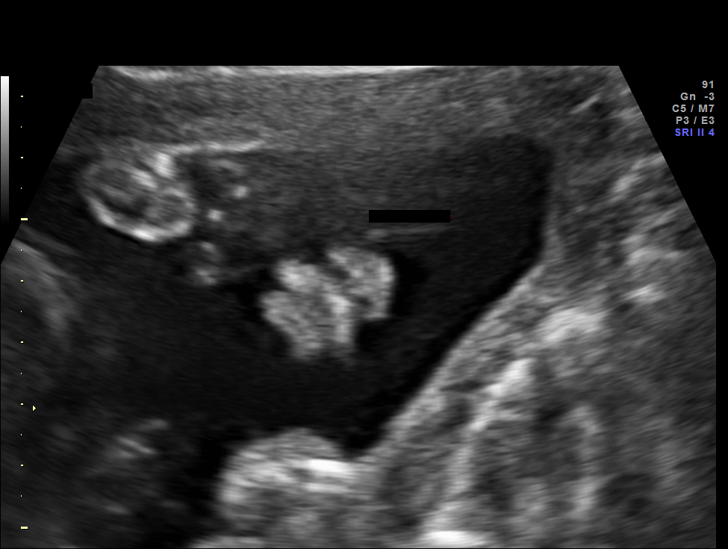
[im 12/61]
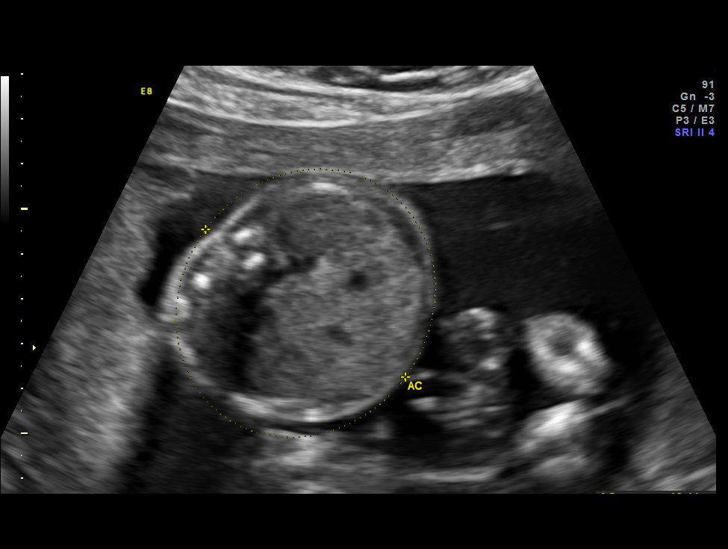
[im 18/61]
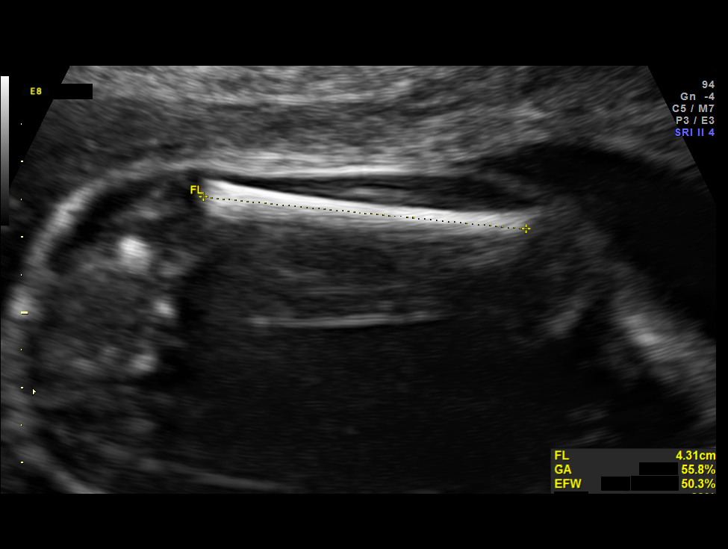
[im 23/61]
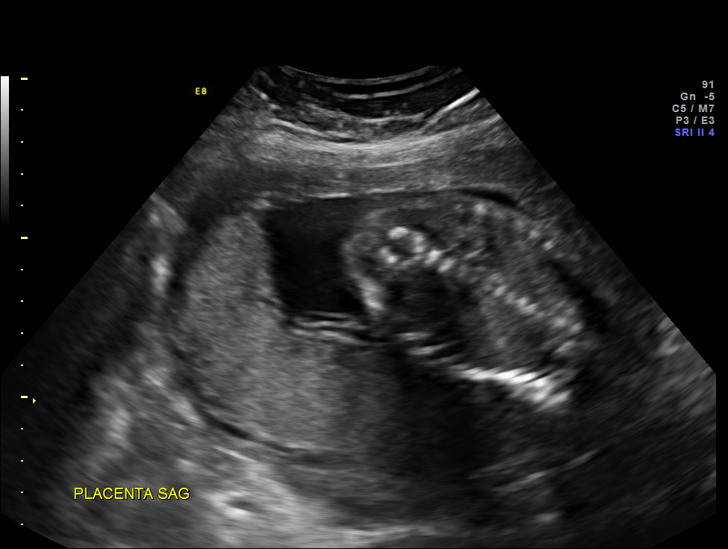
[im 27/61]
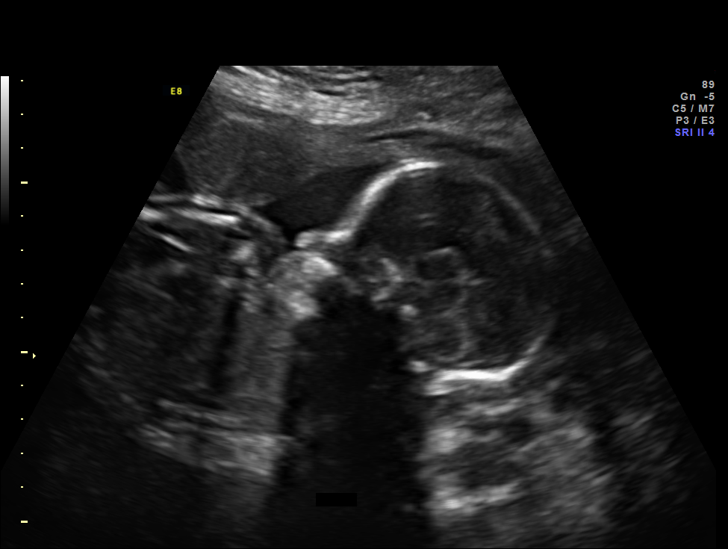
[im 34/61]
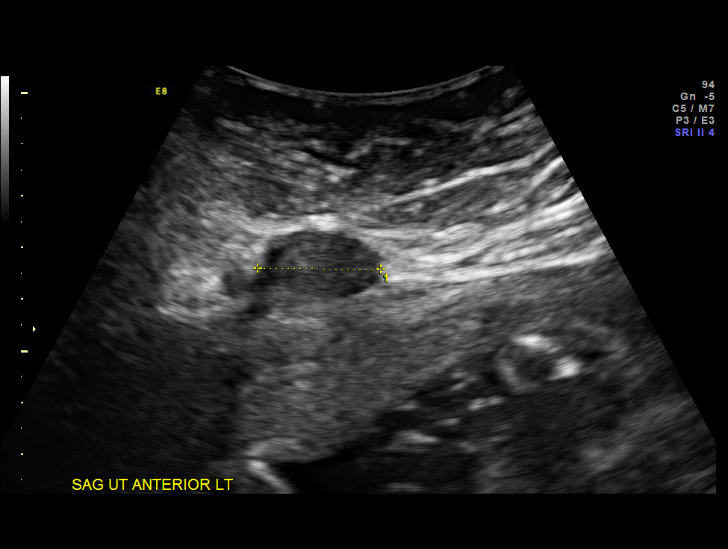
[im 38/61]
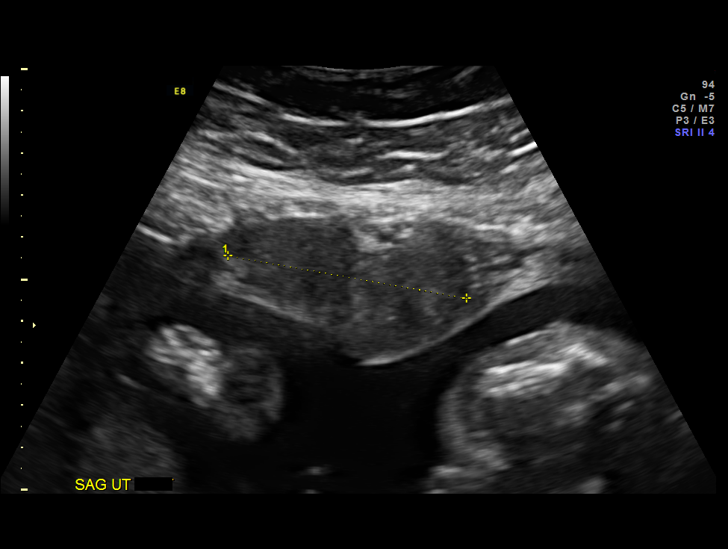
[im 43/61]
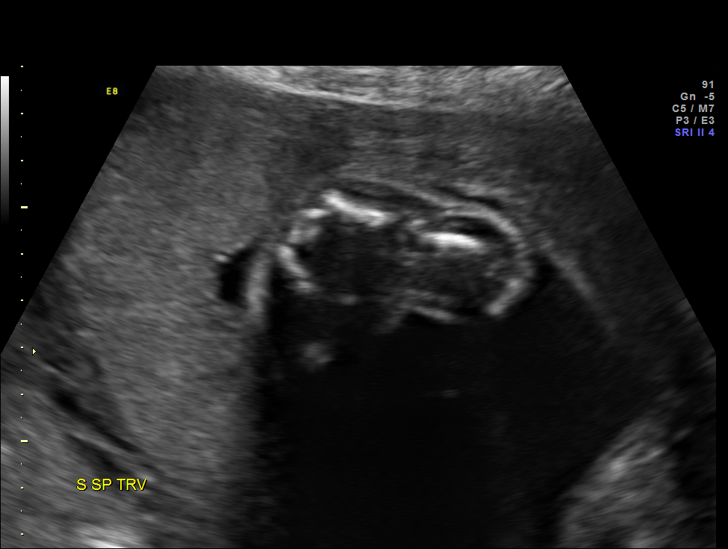
[im 49/61]
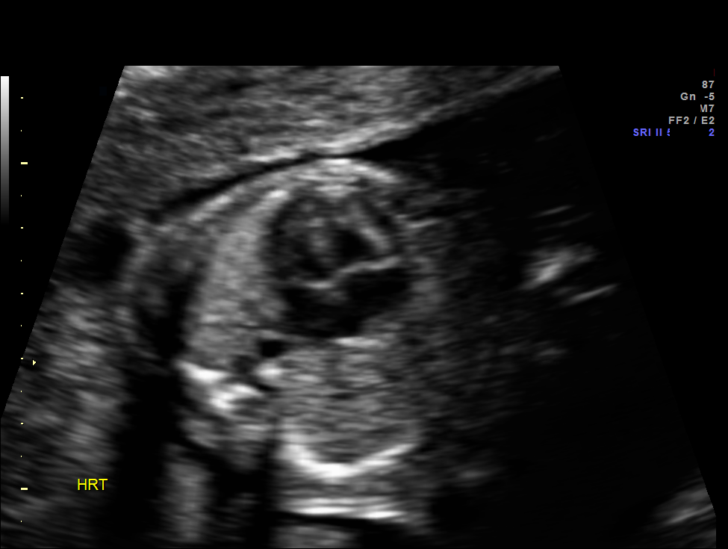
[im 54/61]
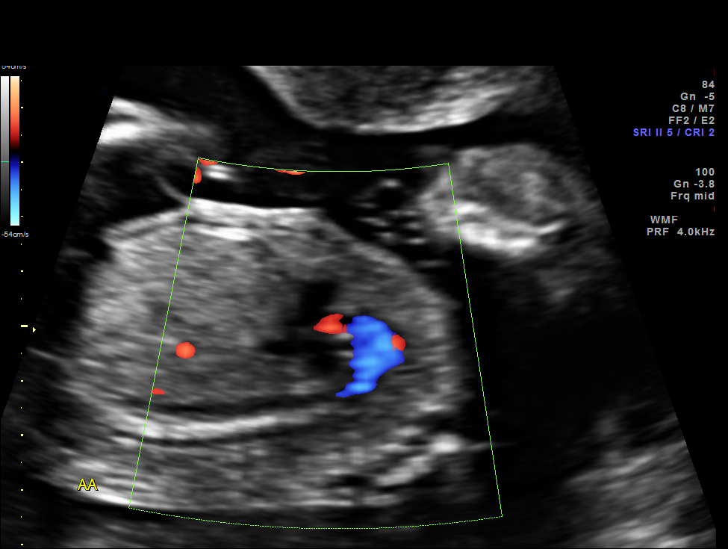
[im 58/61]
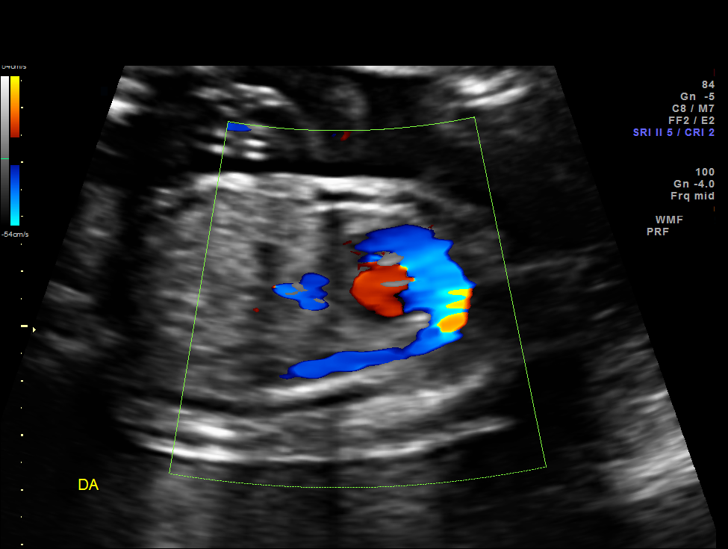

[12 of 28 positions shown; findings below may reference images not displayed]

OBSTETRICS REPORT
                      (Signed Final 04/13/2012 [DATE])

 Order#:         [id number]
Procedures

 US OB FOLLOW UP                                       76816.1
Indications

 F/u detailed fetal anatomic survey (complete
 anatomy)
 Advanced maternal age (age 35 y/o)
 Uterine fibroids
 Increased BMI (261 lb)
 Assess Fetal Growth / Estimated Fetal Weight
Fetal Evaluation

 Cardiac Activity:  Observed
 Presentation:      Cephalic
 Placenta:          Posterior, above cervical
                    os
 P. Cord            Previously Visualized
 Insertion:

 Amniotic Fluid
 AFI FV:      Subjectively within normal limits
Biometry

 BPD:     54.4  mm     G. Age:  22w 4d                CI:         74.2   70 - 86
 OFD:     73.3  mm                                    FL/HC:      20.6   18.7 -

 HC:       207  mm     G. Age:  22w 6d       11  %    HC/AC:      1.12   1.05 -

 AC:     185.4  mm     G. Age:  23w 2d       34  %    FL/BPD:     78.3   71 - 87
 FL:      42.6  mm     G. Age:  24w 0d       50  %    FL/AC:      23.0   20 - 24
 HUM:     41.4  mm     G. Age:  25w 0d       75  %

 Est. FW:     597  gm      1 lb 5 oz     51  %
Gestational Age

 LMP:           23w 4d        Date:  10/31/11                 EDD:   08/06/12
 U/S Today:     23w 1d                                        EDD:   08/09/12
 Best:          23w 4d     Det. By:  LMP  (10/31/11)          EDD:   08/06/12
Anatomy

 Cranium:           Appears normal      Aortic Arch:       Appears normal
 Fetal Cavum:       Appears normal      Ductal Arch:       Appears normal
 Ventricles:        Appears normal      Diaphragm:         Previously seen
 Choroid Plexus:    Appears normal      Stomach:           Appears
                                                           normal, left
                                                           sided
 Cerebellum:        Previously seen     Abdomen:           Appears normal
 Posterior Fossa:   Previously seen     Abdominal Wall:    Previously seen
 Nuchal Fold:       Previously seen     Cord Vessels:      Previously seen
 Face:              Nose/lip            Kidneys:           Appear normal
                    appears NL,
                    Orbits and
                    profile prev seen
 Heart:             Appears normal      Bladder:           Appears normal
                    (4 chamber &
                    axis)
 RVOT:              Previously seen     Spine:             Appears normal
 LVOT:              Previously seen     Limbs:             Previously seen

 Other:     Fetus appears to be a female. Heels and 5th digit
            previously visualized. Nasal bone previously visualized.
Cervix Uterus Adnexa

 Cervical Length:    3.9      cm

 Cervix:       Normal appearance by transabdominal scan.
Myomas

 Site                     L(cm)      W(cm)       D(cm)      Location
 Left
 Anterior
 Anterior Fundus
 Blood Flow                  RI       PI       Comments

Impression

 IUP at 23+4 weeks
 Normal interval anatomy; anatomic survey complete
 Normal amniotic fluid volume
 Appropriate interval growth with EFW at the 51st %tile
 Fibroid uterus: see above for size and location
Recommendations

 Follow-up as clinically indicated

 questions or concerns.

## 2014-10-09 ENCOUNTER — Encounter (HOSPITAL_COMMUNITY): Payer: Self-pay | Admitting: *Deleted

## 2017-12-15 ENCOUNTER — Ambulatory Visit: Payer: Self-pay | Attending: Family Medicine | Admitting: Family Medicine

## 2017-12-15 ENCOUNTER — Encounter: Payer: Self-pay | Admitting: Family Medicine

## 2017-12-15 ENCOUNTER — Telehealth: Payer: Self-pay | Admitting: Family Medicine

## 2017-12-15 VITALS — BP 140/98 | HR 64 | Temp 98.2°F | Resp 16 | Ht 63.0 in | Wt 239.6 lb

## 2017-12-15 DIAGNOSIS — M25561 Pain in right knee: Secondary | ICD-10-CM

## 2017-12-15 DIAGNOSIS — Z23 Encounter for immunization: Secondary | ICD-10-CM

## 2017-12-15 MED ORDER — IBUPROFEN 600 MG PO TABS: 600.0000 mg | ORAL_TABLET | Freq: Three times a day (TID) | ORAL | 0 refills | Status: DC | PRN

## 2017-12-15 MED ORDER — KNEE BRACE MISC: 0 refills | Status: DC

## 2017-12-15 MED ORDER — IBUPROFEN 600 MG PO TABS
600.0000 mg | ORAL_TABLET | Freq: Three times a day (TID) | ORAL | 0 refills | Status: DC | PRN
Start: 2017-12-15 — End: 2018-04-15

## 2017-12-15 NOTE — Progress Notes (Signed)
   Subjective:  Patient ID: Natalie Cervantes, female    DOB: [birth year]  Age: 42 y.o. MRN: [id number]  CC: New Patient (Initial Visit)   HPI Natalie Cervantes presents for c/o acute knee pain. Symptom occurred two week ago. She denies any history of known injury. Associated symptoms include aching and occasional crepitus. She denies any difficulty bearing weight. Symptoms worsened with squatting. She reports taking OTC Aspercreme for symptoms with minimal relief.   No outpatient medications prior to visit.   No facility-administered medications prior to visit.     ROS Review of Systems  Constitutional: Negative.   Respiratory: Negative.   Cardiovascular: Negative.   Musculoskeletal: Positive for arthralgias and myalgias.  Skin: Negative.     Objective:  BP (!) 140/98 (BP Location: Right Arm, Patient Position: Sitting, Cuff Size: Large)   Pulse 64   Temp 98.2 F (36.8 C) (Oral)   Resp 16   Ht 5\' 3"  (1.6 m)   Wt 239 lb 9.6 oz (108.7 kg)   LMP 12/06/2017   SpO2 100%   BMI 42.44 kg/m   BP/Weight 12/15/2017 07/20/2012 12/26/1476  Systolic BP 295 621 -  Diastolic BP 98 71 -  Wt. (Lbs) 239.6 - 273  BMI 42.44 - 46.84    Physical Exam  Constitutional: She appears well-developed and well-nourished.  Cardiovascular: Normal rate, regular rhythm, normal heart sounds and intact distal pulses.  Pulmonary/Chest: Effort normal and breath sounds normal.  Musculoskeletal:       Right knee: She exhibits normal range of motion and no swelling. Tenderness found. Medial joint line tenderness noted.  Skin: Skin is warm and dry.  Psychiatric: She has a normal mood and affect.  Nursing note and vitals reviewed.  Assessment & Plan:   1. Acute pain of right knee  - Elastic Bandages & Supports (KNEE BRACE) MISC; APPLY ONE BRACE TO RIGHT KNEE FOR STABILITY AND SUPPORT. TO BE FITTED BY MEDICAL SUPPLY.  Dispense: 1 each; Refill: 0 - ibuprofen (ADVIL,MOTRIN) 600 MG tablet; Take 1 tablet (600 mg total)  by mouth every 8 (eight) hours as needed. Take with food.  Dispense: 30 tablet; Refill: 0  2. Immunization due  - Flu Vaccine QUAD 6+ mos PF IM (Fluarix Quad PF)      Follow-up: Return in about 2 weeks (around 12/29/2017) for Physical .   Alfonse Spruce FNP

## 2017-12-15 NOTE — Telephone Encounter (Signed)
Called and left message for call back.

## 2017-12-15 NOTE — Progress Notes (Signed)
Patient stated she get pain on her right knee. Patient stated it start a month ago. Patient stated she have hard time breathing sometimes, and it's mostly at night. Patient stated when she have her menstrual cycle it's heavy. Patient stated she feels irritated and tired most of the time. Patient stated she also don't have the urge to have sex.

## 2017-12-15 NOTE — Patient Instructions (Signed)

## 2017-12-16 NOTE — Telephone Encounter (Signed)
I called patient to schedule the appointment, the patient did not answer so I left a voicemail for the patient to call back to be scheduled for a appointment for tomorrow or Friday. Per PCP.

## 2017-12-16 NOTE — Telephone Encounter (Signed)
-----   Message from Alfonse Spruce, [place] sent at 12/15/2017  6:35 PM EST ----- Regarding: Appointment Hello,  Called and left voicemail message asking patient to call the office tomorrow. Based on patient availability and providers schedule, please schedule a 15 min follow up Thursday or Friday of this week per PCP request. Thanks.  Sincerely,  Fredia Beets FNP-BC

## 2017-12-18 ENCOUNTER — Encounter: Payer: Self-pay | Admitting: Family Medicine

## 2017-12-18 ENCOUNTER — Ambulatory Visit: Payer: Self-pay | Attending: Family Medicine | Admitting: Family Medicine

## 2017-12-18 VITALS — BP 137/85 | HR 90 | Temp 99.1°F | Resp 18 | Ht 63.0 in | Wt 239.0 lb

## 2017-12-18 DIAGNOSIS — B9789 Other viral agents as the cause of diseases classified elsewhere: Secondary | ICD-10-CM

## 2017-12-18 DIAGNOSIS — R5383 Other fatigue: Secondary | ICD-10-CM

## 2017-12-18 DIAGNOSIS — I10 Essential (primary) hypertension: Secondary | ICD-10-CM | POA: Insufficient documentation

## 2017-12-18 DIAGNOSIS — Z30011 Encounter for initial prescription of contraceptive pills: Secondary | ICD-10-CM | POA: Insufficient documentation

## 2017-12-18 DIAGNOSIS — N921 Excessive and frequent menstruation with irregular cycle: Secondary | ICD-10-CM

## 2017-12-18 DIAGNOSIS — R0681 Apnea, not elsewhere classified: Secondary | ICD-10-CM | POA: Insufficient documentation

## 2017-12-18 DIAGNOSIS — N920 Excessive and frequent menstruation with regular cycle: Secondary | ICD-10-CM | POA: Insufficient documentation

## 2017-12-18 DIAGNOSIS — J069 Acute upper respiratory infection, unspecified: Secondary | ICD-10-CM | POA: Insufficient documentation

## 2017-12-18 LAB — POCT URINE PREGNANCY: Preg Test, Ur: NEGATIVE

## 2017-12-18 MED ORDER — GUAIFENESIN-DM 100-10 MG/5ML PO SYRP: 5.0000 mL | ORAL_SOLUTION | ORAL | 0 refills | Status: DC | PRN

## 2017-12-18 MED ORDER — FLUTICASONE PROPIONATE 50 MCG/ACT NA SUSP: 2.0000 | Freq: Every day | NASAL | 0 refills | Status: DC | PRN

## 2017-12-18 MED ORDER — AMLODIPINE BESYLATE 2.5 MG PO TABS: 2.5000 mg | ORAL_TABLET | Freq: Every day | ORAL | 2 refills | Status: DC

## 2017-12-18 MED ORDER — NORGESTIM-ETH ESTRAD TRIPHASIC 0.18/0.215/0.25 MG-25 MCG PO TABS
1.0000 | ORAL_TABLET | Freq: Every day | ORAL | 11 refills | Status: DC
Start: 2017-12-18 — End: 2018-02-22

## 2017-12-18 NOTE — Progress Notes (Signed)
Subjective:  Patient ID: Natalie Cervantes, female    DOB: Jan 13, 1976  Age: 42 y.o. MRN: [id number]  CC: URI   HPI Natalie Cervantes presents for multiple concerns. History of elevated BP: Non-smoker. Reports checking BP at home. Readings 140's-160's. History of elevated BP for 2 years. She denies any cardiac symptoms. Possible URI: Symptom onset Tuesday. Symptoms include cough, rhinorrhea she denies any fevers or chills. Dyspnea: She c/o 5 years history of SOB night. She reports sensation of gasping for breath at night. She denies any snoring or wheezing. She does report fatigue. No history of lung disease. Menorrhagia: History of heavy menstrual periods. Reports cycles last 1 week. Reports menstrual cycle every month but notice occasional irregular cycles of 2 menses in one month for last 3 years. She uses 10 to 12 super-absorbency pads per cycle. 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 Current not using any hormonal contraceptive therapies. She is agreeable to try OCP. Encounter for birth control: No history of bleeding or clotting disorder, chronic headaches, swelling, or family/personal history of breast or gyn cancers.    Outpatient Medications Prior to Visit  Medication Sig Dispense Refill  . Elastic Bandages & Supports (KNEE BRACE) MISC APPLY ONE BRACE TO RIGHT KNEE FOR STABILITY AND SUPPORT. TO BE FITTED BY MEDICAL SUPPLY. 1 each 0  . ibuprofen (ADVIL,MOTRIN) 600 MG tablet Take 1 tablet (600 mg total) by mouth every 8 (eight) hours as needed. Take with food. 30 tablet 0   No facility-administered medications prior to visit.     ROS Review of Systems  Constitutional: Positive for fatigue.  HENT: Positive for rhinorrhea.   Respiratory: Positive for cough and shortness of breath.   Cardiovascular: Negative.   Gastrointestinal: Negative.   Genitourinary: Positive for menstrual problem.  Skin: Negative.   Psychiatric/Behavioral: Negative.     Objective:  BP 137/85 (BP Location:  Left Arm, Patient Position: Sitting, Cuff Size: Normal)   Pulse 90   Temp 99.1 F (37.3 C) (Oral)   Resp 18   Ht 5\' 3"  (1.6 m)   Wt 239 lb (108.4 kg)   LMP 12/06/2017   SpO2 98%   BMI 42.34 kg/m   BP/Weight 12/18/2017 12/15/2017 02/20/1760  Systolic BP 607 371 062  Diastolic BP 85 98 71  Wt. (Lbs) 239 239.6 -  BMI 42.34 42.44 -     Physical Exam  Constitutional: She appears well-developed and well-nourished.  HENT:  Head: Normocephalic and atraumatic.  Right Ear: External ear normal.  Left Ear: External ear normal.  Nose: Mucosal edema and rhinorrhea present.  Mouth/Throat: Oropharynx is clear and moist.  Eyes: Conjunctivae are normal. Right eye exhibits no discharge. Left eye exhibits no discharge.  Neck: No JVD present.  Cardiovascular: Normal rate, regular rhythm, normal heart sounds and intact distal pulses.  Pulmonary/Chest: Effort normal and breath sounds normal. She has no wheezes.  Abdominal: Soft. Bowel sounds are normal. There is no tenderness.  Lymphadenopathy:    She has no cervical adenopathy.  Skin: Skin is warm and dry.  Nursing note and vitals reviewed.    Assessment & Plan:   1. Menorrhagia with irregular cycle  - Basic metabolic panel - CBC - US Pelvis Complete; Future - US Transvaginal Non-OB; Future - Norgestimate-Ethinyl Estradiol Triphasic (ORTHO TRI-CYCLEN LO) 0.18/0.215/0.25 MG-25 MCG tab; Take 1 tablet by mouth daily.  Dispense: 1 Package; Refill: 11  2. Apneic episode  - Nocturnal polysomnography (NPSG); Future  3. Essential hypertension  - amLODipine (NORVASC)  2.5 MG tablet; Take 1 tablet (2.5 mg total) by mouth daily.  Dispense: 30 tablet; Refill: 2  4. Encounter for initial prescription of contraceptive pills  - POCT urine pregnancy - Norgestimate-Ethinyl Estradiol Triphasic (ORTHO TRI-CYCLEN LO) 0.18/0.215/0.25 MG-25 MCG tab; Take 1 tablet by mouth daily.  Dispense: 1 Package; Refill: 11  5. Fatigue, unspecified type  -  CBC  6. Viral URI with cough  - fluticasone (FLONASE) 50 MCG/ACT nasal spray; Place 2 sprays into both nostrils daily as needed for rhinitis.  Dispense: 16 g; Refill: 0 - guaiFENesin-dextromethorphan (ROBITUSSIN DM) 100-10 MG/5ML syrup; Take 5 mLs by mouth every 4 (four) hours as needed for cough.  Dispense: 236 mL; Refill: 0       Follow-up: Return in about 2 weeks (around 01/01/2018), or if symptoms worsen or fail to improve, for Physical/ BP .   Alfonse Spruce FNP

## 2017-12-18 NOTE — Patient Instructions (Signed)
Oral Contraception Use Oral contraceptive pills (OCPs) are medicines taken to prevent pregnancy. OCPs work by preventing the ovaries from releasing eggs. The hormones in OCPs also cause the cervical mucus to thicken, preventing the sperm from entering the uterus. The hormones also cause the uterine lining to become thin, not allowing a fertilized egg to attach to the inside of the uterus. OCPs are highly effective when taken exactly as prescribed. However, OCPs do not prevent sexually transmitted diseases (STDs). Safe sex practices, such as using condoms along with an OCP, can help prevent STDs. Before taking OCPs, you may have a physical exam and Pap test. Your health care provider may also order blood tests if necessary. Your health care provider will make sure you are a good candidate for oral contraception. Discuss with your health care provider the possible side effects of the OCP you may be prescribed. When starting an OCP, it can take 2 to 3 months for the body to adjust to the changes in hormone levels in your body. How to take oral contraceptive pills Your health care provider may advise you on how to start taking the first cycle of OCPs. Otherwise, you can:  Start on day 1 of your menstrual period. You will not need any backup contraceptive protection with this start time.  Start on the first Sunday after your menstrual period or the day you get your prescription. In these cases, you will need to use backup contraceptive protection for the first week.  Start the pill at any time of your cycle. If you take the pill within 5 days of the start of your period, you are protected against pregnancy right away. In this case, you will not need a backup form of birth control. If you start at any other time of your menstrual cycle, you will need to use another form of birth control for 7 days. If your OCP is the type called a minipill, it will protect you from pregnancy after taking it for 2 days (48  hours).  After you have started taking OCPs:  If you forget to take 1 pill, take it as soon as you remember. Take the next pill at the regular time.  If you miss 2 or more pills, call your health care provider because different pills have different instructions for missed doses. Use backup birth control until your next menstrual period starts.  If you use a 28-day pack that contains inactive pills and you miss 1 of the last 7 pills (pills with no hormones), it will not matter. Throw away the rest of the non-hormone pills and start a new pill pack.  No matter which day you start the OCP, you will always start a new pack on that same day of the week. Have an extra pack of OCPs and a backup contraceptive method available in case you miss some pills or lose your OCP pack. Follow these instructions at home:  Do not smoke.  Always use a condom to protect against STDs. OCPs do not protect against STDs.  Use a calendar to mark your menstrual period days.  Read the information and directions that came with your OCP. Talk to your health care provider if you have questions. Contact a health care provider if:  You develop nausea and vomiting.  You have abnormal vaginal discharge or bleeding.  You develop a rash.  You miss your menstrual period.  You are losing your hair.  You need treatment for mood swings or depression.  You   get dizzy when taking the OCP.  You develop acne from taking the OCP.  You become pregnant. Get help right away if:  You develop chest pain.  You develop shortness of breath.  You have an uncontrolled or severe headache.  You develop numbness or slurred speech.  You develop visual problems.  You develop pain, redness, and swelling in the legs. This information is not intended to replace advice given to you by your health care provider. Make sure you discuss any questions you have with your health care provider. Document Released: 11/13/2011 Document  Revised: 05/01/2016 Document Reviewed: 05/15/2013 Elsevier Interactive Patient Education  2017 Elsevier Inc[employer]  

## 2017-12-19 LAB — BASIC METABOLIC PANEL
BUN/Creatinine Ratio: 9 (ref 9–23)
BUN: 8 mg/dL (ref 6–24)
CHLORIDE: 103 mmol/L (ref 96–106)
CO2: 23 mmol/L (ref 20–29)
Calcium: 9.3 mg/dL (ref 8.7–10.2)
Creatinine, Ser: 0.9 mg/dL (ref 0.57–1.00)
GFR calc non Af Amer: 79 mL/min/{1.73_m2} (ref >59)
GFR, EST AFRICAN AMERICAN: 91 mL/min/{1.73_m2} (ref >59)
GLUCOSE: 103 mg/dL — AB (ref 65–99)
Potassium: 4 mmol/L (ref 3.5–5.2)
SODIUM: 140 mmol/L (ref 134–144)

## 2017-12-19 LAB — CBC
Hematocrit: 31 % — ABNORMAL LOW (ref 34.0–46.6)
Hemoglobin: 8.3 g/dL — ABNORMAL LOW (ref 11.1–15.9)
MCH: 18.2 pg — AB (ref 26.6–33.0)
MCHC: 26.8 g/dL — AB (ref 31.5–35.7)
MCV: 68 fL — ABNORMAL LOW (ref 79–97)
PLATELETS: 385 10*3/uL — AB (ref 150–379)
RBC: 4.57 x10E6/uL (ref 3.77–5.28)
RDW: 25.2 % — ABNORMAL HIGH (ref 12.3–15.4)
WBC: 9.1 10*3/uL (ref 3.4–10.8)

## 2017-12-24 ENCOUNTER — Ambulatory Visit (HOSPITAL_COMMUNITY): Payer: Self-pay

## 2017-12-25 ENCOUNTER — Ambulatory Visit (HOSPITAL_COMMUNITY)
Admission: RE | Admit: 2017-12-25 | Discharge: 2017-12-25 | Disposition: A | Discharge status: Home / Self Care | Payer: Self-pay | Source: Ambulatory Visit | Attending: Family Medicine | Admitting: Family Medicine

## 2017-12-25 DIAGNOSIS — D259 Leiomyoma of uterus, unspecified: Secondary | ICD-10-CM | POA: Insufficient documentation

## 2017-12-25 DIAGNOSIS — N921 Excessive and frequent menstruation with irregular cycle: Secondary | ICD-10-CM | POA: Insufficient documentation

## 2017-12-28 ENCOUNTER — Other Ambulatory Visit: Payer: Self-pay | Admitting: Family Medicine

## 2017-12-28 DIAGNOSIS — N920 Excessive and frequent menstruation with regular cycle: Secondary | ICD-10-CM

## 2017-12-28 DIAGNOSIS — D500 Iron deficiency anemia secondary to blood loss (chronic): Secondary | ICD-10-CM

## 2017-12-28 DIAGNOSIS — D259 Leiomyoma of uterus, unspecified: Secondary | ICD-10-CM

## 2017-12-28 MED ORDER — FERROUS SULFATE 325 (65 FE) MG PO TABS: 325.0000 mg | ORAL_TABLET | Freq: Three times a day (TID) | ORAL | 3 refills | Status: DC

## 2017-12-31 ENCOUNTER — Ambulatory Visit: Payer: Self-pay | Attending: Family Medicine | Admitting: Family Medicine

## 2017-12-31 ENCOUNTER — Encounter: Payer: Self-pay | Admitting: Family Medicine

## 2017-12-31 ENCOUNTER — Other Ambulatory Visit: Payer: Self-pay

## 2017-12-31 VITALS — BP 134/82 | HR 52 | Temp 98.2°F | Resp 16 | Ht 64.0 in | Wt 240.0 lb

## 2017-12-31 DIAGNOSIS — H9312 Tinnitus, left ear: Secondary | ICD-10-CM | POA: Insufficient documentation

## 2017-12-31 DIAGNOSIS — Z79899 Other long term (current) drug therapy: Secondary | ICD-10-CM | POA: Insufficient documentation

## 2017-12-31 DIAGNOSIS — I10 Essential (primary) hypertension: Secondary | ICD-10-CM | POA: Insufficient documentation

## 2017-12-31 DIAGNOSIS — D509 Iron deficiency anemia, unspecified: Secondary | ICD-10-CM | POA: Insufficient documentation

## 2017-12-31 DIAGNOSIS — R0602 Shortness of breath: Secondary | ICD-10-CM | POA: Insufficient documentation

## 2017-12-31 DIAGNOSIS — D500 Iron deficiency anemia secondary to blood loss (chronic): Secondary | ICD-10-CM | POA: Insufficient documentation

## 2017-12-31 DIAGNOSIS — D259 Leiomyoma of uterus, unspecified: Secondary | ICD-10-CM | POA: Insufficient documentation

## 2017-12-31 DIAGNOSIS — H547 Unspecified visual loss: Secondary | ICD-10-CM

## 2017-12-31 DIAGNOSIS — Z0000 Encounter for general adult medical examination without abnormal findings: Secondary | ICD-10-CM | POA: Insufficient documentation

## 2017-12-31 LAB — POCT GLYCOSYLATED HEMOGLOBIN (HGB A1C): Hemoglobin A1C: 5.2

## 2017-12-31 MED ORDER — AMLODIPINE BESYLATE 5 MG PO TABS: 5.0000 mg | ORAL_TABLET | Freq: Every day | ORAL | 2 refills | Status: DC

## 2017-12-31 NOTE — Patient Instructions (Signed)
Exercising to Stay Healthy Exercising regularly is important. It has many health benefits, such as:  Improving your overall fitness, flexibility, and endurance.  Increasing your bone density.  Helping with weight control.  Decreasing your body fat.  Increasing your muscle strength.  Reducing stress and tension.  Improving your overall health.  In order to become healthy and stay healthy, it is recommended that you do moderate-intensity and vigorous-intensity exercise. You can tell that you are exercising at a moderate intensity if you have a higher heart rate and faster breathing, but you are still able to hold a conversation. You can tell that you are exercising at a vigorous intensity if you are breathing much harder and faster and cannot hold a conversation while exercising. How often should I exercise? Choose an activity that you enjoy and set realistic goals. Your health care provider can help you to make an activity plan that works for you. Exercise regularly as directed by your health care provider. This may include:  Doing resistance training twice each week, such as: ? Push-ups. ? Sit-ups. ? Lifting weights. ? Using resistance bands.  Doing a given intensity of exercise for a given amount of time. Choose from these options: ? 150 minutes of moderate-intensity exercise every week. ? 75 minutes of vigorous-intensity exercise every week. ? A mix of moderate-intensity and vigorous-intensity exercise every week.  Children, pregnant women, people who are out of shape, people who are overweight, and older adults may need to consult a health care provider for individual recommendations. If you have any sort of medical condition, be sure to consult your health care provider before starting a new exercise program. What are some exercise ideas? Some moderate-intensity exercise ideas include:  Walking at a rate of 1 mile in 15  minutes.  Biking.  Hiking.  Golfing.  Dancing.  Some vigorous-intensity exercise ideas include:  Walking at a rate of at least 4.5 miles per hour.  Jogging or running at a rate of 5 miles per hour.  Biking at a rate of at least 10 miles per hour.  Lap swimming.  Roller-skating or in-line skating.  Cross-country skiing.  Vigorous competitive sports, such as football, basketball, and soccer.  Jumping rope.  Aerobic dancing.  What are some everyday activities that can help me to get exercise?  Yard work, such as: ? Pushing a lawn mower. ? Raking and bagging leaves.  Washing and waxing your car.  Pushing a stroller.  Shoveling snow.  Gardening.  Washing windows or floors. How can I be more active in my day-to-day activities?  Use the stairs instead of the elevator.  Take a walk during your lunch break.  If you drive, park your car farther away from work or school.  If you take public transportation, get off one stop early and walk the rest of the way.  Make all of your phone calls while standing up and walking around.  Get up, stretch, and walk around every 30 minutes throughout the day. What guidelines should I follow while exercising?  Do not exercise so much that you hurt yourself, feel dizzy, or get very short of breath.  Consult your health care provider before starting a new exercise program.  Wear comfortable clothes and shoes with good support.  Drink plenty of water while you exercise to prevent dehydration or heat stroke. Body water is lost during exercise and must be replaced.  Work out until you breathe faster and your heart beats faster. This information is not   intended to replace advice given to you by your health care provider. Make sure you discuss any questions you have with your health care provider. Document Released: 12/27/2010 Document Revised: 05/01/2016 Document Reviewed: 04/27/2014 Elsevier Interactive Patient Education  2018  Elsevier Inc.  

## 2017-12-31 NOTE — Progress Notes (Signed)
CPE

## 2017-12-31 NOTE — Progress Notes (Signed)
Subjective:   Patient ID: Natalie Cervantes, female    DOB: 1975-12-10, 42 y.o.   MRN: [id number]  Chief Complaint  Patient presents with  . Annual Exam   HPI Natalie Cervantes 42 y.o. female who presents for annual comprehensive physical exam . She denies any family history of DM, HTN, or Cancer. She does  c/o 5 years history of SOB night. She reports sensation of gasping for breath at night. Referral for sleep study in progress. She  Does report tinnitus in left ear for about 4 years. Sound is referred to roaring. She denies any history of ear trauma  or drainage of the ear. She reports decreased visual acuity, she can see better further away then closer . Last eye exam was greater than 1 year ago. History of hypertension. She is adherent with taking antihypertensives. She checks her BP at home. Ranges varies from 120's-140's SBP, 60-70's DBP. She is not a current smoker. She denies any illicit drug or alcohol use. Denies symptoms of all other pertinent systems.   Past Medical History:  Diagnosis Date  . No pertinent past medical history     Past Surgical History:  Procedure Laterality Date  . NO PAST SURGERIES      No family history on file.  Social History   Socioeconomic History  . Marital status: Single    Spouse name: Not on file  . Number of children: Not on file  . Years of education: Not on file  . Highest education level: Not on file  Social Needs  . Financial resource strain: Not on file  . Food insecurity - worry: Not on file  . Food insecurity - inability: Not on file  . Transportation needs - medical: Not on file  . Transportation needs - non-medical: Not on file  Occupational History  . Not on file  Tobacco Use  . Smoking status: Never Smoker  . Smokeless tobacco: Never Used  Substance and Sexual Activity  . Alcohol use: No  . Drug use: No  . Sexual activity: Yes    Birth control/protection: None  Other Topics Concern  . Not on file  Social History Narrative    . Not on file    Outpatient Medications Prior to Visit  Medication Sig Dispense Refill  . ferrous sulfate 325 (65 FE) MG tablet Take 1 tablet (325 mg total) by mouth 3 (three) times daily with meals. 90 tablet 3  . ibuprofen (ADVIL,MOTRIN) 600 MG tablet Take 1 tablet (600 mg total) by mouth every 8 (eight) hours as needed. Take with food. 30 tablet 0  . Norgestimate-Ethinyl Estradiol Triphasic (ORTHO TRI-CYCLEN LO) 0.18/0.215/0.25 MG-25 MCG tab Take 1 tablet by mouth daily. 1 Package 11  . amLODipine (NORVASC) 2.5 MG tablet Take 1 tablet (2.5 mg total) by mouth daily. 30 tablet 2  . Elastic Bandages & Supports (KNEE BRACE) MISC APPLY ONE BRACE TO RIGHT KNEE FOR STABILITY AND SUPPORT. TO BE FITTED BY MEDICAL SUPPLY. 1 each 0  . guaiFENesin-dextromethorphan (ROBITUSSIN DM) 100-10 MG/5ML syrup Take 5 mLs by mouth every 4 (four) hours as needed for cough. 236 mL 0  . fluticasone (FLONASE) 50 MCG/ACT nasal spray Place 2 sprays into both nostrils daily as needed for rhinitis. (Patient not taking: Reported on 12/31/2017) 16 g 0   No facility-administered medications prior to visit.     Not on File  Review of Systems  Constitutional: Negative.   HENT: Positive for tinnitus.   Eyes: Positive for blurred vision.  Respiratory: Negative for wheezing. Shortness of breath: history    Cardiovascular: Negative.   Gastrointestinal: Negative.   Genitourinary: Negative.   Musculoskeletal: Negative.   Skin: Negative.   Neurological: Negative.   Endo/Heme/Allergies: Negative.   Psychiatric/Behavioral: Negative.        Objective:    Physical Exam  Constitutional: She is oriented to person, place, and time. She appears well-developed and well-nourished.  HENT:  Head: Normocephalic and atraumatic.  Right Ear: External ear normal. No swelling or tenderness. Tympanic membrane is not erythematous and not bulging.  Left Ear: External ear normal. No swelling or tenderness. Tympanic membrane is not  erythematous and not bulging.  Nose: Nose normal.  Mouth/Throat: Oropharynx is clear and moist.  Eyes: Conjunctivae and EOM are normal. Pupils are equal, round, and reactive to light.  Neck: Normal range of motion. Neck supple. No thyromegaly present.  Cardiovascular: Normal rate, regular rhythm, normal heart sounds and intact distal pulses.  Pulmonary/Chest: Effort normal and breath sounds normal. No respiratory distress. She has no wheezes.  Abdominal: Soft. Bowel sounds are normal. There is no tenderness.  Musculoskeletal: Normal range of motion.  Lymphadenopathy:    She has no cervical adenopathy.  Neurological: She is alert and oriented to person, place, and time. She has normal reflexes.  Skin: Skin is warm and dry.  Psychiatric: She has a normal mood and affect.  Nursing note and vitals reviewed.   BP 134/82 (BP Location: Right Arm, Patient Position: Sitting, Cuff Size: Large)   Pulse (!) 52   Temp 98.2 F (36.8 C) (Oral)   Resp 16   Ht 5\' 4"  (1.626 m)   Wt 240 lb (108.9 kg)   LMP 12/24/2017 (Approximate)   SpO2 100%   BMI 41.20 kg/m  Wt Readings from Last 3 Encounters:  12/31/17 240 lb (108.9 kg)  12/18/17 239 lb (108.4 kg)  12/15/17 239 lb 9.6 oz (108.7 kg)    Immunization History  Administered Date(s) Administered  . Influenza,inj,Quad PF,6+ Mos 12/15/2017  . Tdap 07/19/2012    No results found for: TSH Lab Results  Component Value Date   WBC 9.1 12/18/2017   HGB 8.3 (L) 12/18/2017   HCT 31.0 (L) 12/18/2017   MCV 68 (L) 12/18/2017   PLT 385 (H) 12/18/2017   Lab Results  Component Value Date   NA 140 12/18/2017   K 4.0 12/18/2017   CO2 23 12/18/2017   GLUCOSE 103 (H) 12/18/2017   BUN 8 12/18/2017   CREATININE 0.90 12/18/2017   CALCIUM 9.3 12/18/2017   No results found for: CHOL No results found for: HDL No results found for: LDLCALC No results found for: TRIG No results found for: [facility] Lab Results  Component Value Date   HGBA1C 5.2  12/31/2017       Assessment & Plan:   1. Encounter for annual physical examination excluding gynecological examination in a patient older than 17 years  - CMP and Liver - CBC - Vitamin D, 25-hydroxy - POCT glycosylated hemoglobin (Hb A1C) - TSH - Lipid Panel  2. Uterine leiomyoma, unspecified location Informed of imaging results in office and referral.  3. Iron deficiency anemia due to chronic blood loss Continue iron supplementation.   4. Decreased visual acuity Visual acuity screen performed. - Ambulatory referral to Ophthalmology  5. Essential hypertension Inc.dosage of amlodipine. BP recheck in office in two weeks. - amLODipine (NORVASC) 5 MG tablet; Take 1 tablet (5 mg total) by mouth daily.  Dispense: 30 tablet; Refill: 2  6. Tinnitus of left ear  - Ambulatory referral to Audiology    Follow up: Return in about 2 weeks (around 01/14/2018) for PAP/ BP.    Fredia Beets, FNP

## 2018-01-01 LAB — CMP AND LIVER
ALT: 11 IU/L (ref 0–32)
AST: 15 IU/L (ref 0–40)
Albumin: 4.2 g/dL (ref 3.5–5.5)
Alkaline Phosphatase: 55 IU/L (ref 39–117)
BILIRUBIN TOTAL: 0.3 mg/dL (ref 0.0–1.2)
BUN: 9 mg/dL (ref 6–24)
Bilirubin, Direct: 0.07 mg/dL (ref 0.00–0.40)
CALCIUM: 9.3 mg/dL (ref 8.7–10.2)
CHLORIDE: 100 mmol/L (ref 96–106)
CO2: 23 mmol/L (ref 20–29)
Creatinine, Ser: 0.87 mg/dL (ref 0.57–1.00)
GFR, EST AFRICAN AMERICAN: 94 mL/min/{1.73_m2} (ref >59)
GFR, EST NON AFRICAN AMERICAN: 82 mL/min/{1.73_m2} (ref >59)
GLUCOSE: 105 mg/dL — AB (ref 65–99)
Potassium: 4.4 mmol/L (ref 3.5–5.2)
Sodium: 138 mmol/L (ref 134–144)
TOTAL PROTEIN: 7 g/dL (ref 6.0–8.5)

## 2018-01-01 LAB — CBC
HEMATOCRIT: 25.5 % — AB (ref 34.0–46.6)
HEMOGLOBIN: 7.1 g/dL — AB (ref 11.1–15.9)
MCH: 18.3 pg — AB (ref 26.6–33.0)
MCHC: 27.8 g/dL — AB (ref 31.5–35.7)
MCV: 66 fL — ABNORMAL LOW (ref 79–97)
Platelets: 480 10*3/uL — ABNORMAL HIGH (ref 150–379)
RBC: 3.89 x10E6/uL (ref 3.77–5.28)
RDW: 24.6 % — ABNORMAL HIGH (ref 12.3–15.4)
WBC: 5.8 10*3/uL (ref 3.4–10.8)

## 2018-01-01 LAB — TSH: TSH: 3.65 u[IU]/mL (ref 0.450–4.500)

## 2018-01-01 LAB — VITAMIN D 25 HYDROXY (VIT D DEFICIENCY, FRACTURES): VIT D 25 HYDROXY: 39.7 ng/mL (ref 30.0–100.0)

## 2018-01-01 LAB — LIPID PANEL
CHOL/HDL RATIO: 2.7 ratio (ref 0.0–4.4)
Cholesterol, Total: 180 mg/dL (ref 100–199)
HDL: 66 mg/dL (ref >39)
LDL Calculated: 100 mg/dL — ABNORMAL HIGH (ref 0–99)
Triglycerides: 68 mg/dL (ref 0–149)
VLDL CHOLESTEROL CAL: 14 mg/dL (ref 5–40)

## 2018-01-04 ENCOUNTER — Telehealth (INDEPENDENT_AMBULATORY_CARE_PROVIDER_SITE_OTHER): Payer: Self-pay | Admitting: *Deleted

## 2018-01-04 NOTE — Telephone Encounter (Signed)
-----   Message from Alfonse Spruce, [place] sent at 12/28/2017  9:29 AM EST ----- Ultrasound shows uterine fibroids. You will be referred to gynecology. Labs show chronic anemia. You will be prescribed iron supplements. Recommend f/u in 3 months.

## 2018-01-04 NOTE — Telephone Encounter (Signed)
Patient verified DOB Patient is aware of chronic anemia and has began the iron supplements. Patient is also aware of women's clinic at the [facility] hospital is where her referral was placed and she will receive a phone call within the next 3 weeks with an appointment. No further questions.

## 2018-01-08 ENCOUNTER — Telehealth: Payer: Self-pay

## 2018-01-08 NOTE — Telephone Encounter (Signed)
-----   Message from Alfonse Spruce, [place] sent at 01/07/2018  8:44 AM EST ----- CBC lab shows findings consistent with chronic anemia. Take your iron supplements. Follow up with gynecology referral. If you start to experience any chest pain, sudden difficulty breathing, or dizziness go to the ED.  Labs normal Kidney function normal Liver function normal Vitamin d levels are normal. Vitamin d is important for bone and muscle health. Thyroid function normal Cholesterol levels normal Diabetes screening negative for diabetes.

## 2018-01-08 NOTE — Telephone Encounter (Signed)
CMA attempt to call patient regarding results.  Patient did not answer and CMA left a detail message for patient/callback number for patient.  If patient call back please let her know:  !!CBC lab shows findings consistent with chronic anemia. Take your iron supplements. Follow up with gynecology referral. If you start to experience any chest pain, sudden difficulty breathing, or dizziness go to the ED.  Labs normal Kidney function normal Liver function normal Vitamin d levels are normal. Vitamin d is important for bone and muscle health. Thyroid function normal Cholesterol levels normal Diabetes screening negative for diabetes.!!

## 2018-01-14 ENCOUNTER — Encounter (HOSPITAL_BASED_OUTPATIENT_CLINIC_OR_DEPARTMENT_OTHER): Payer: Self-pay

## 2018-01-15 ENCOUNTER — Ambulatory Visit (HOSPITAL_BASED_OUTPATIENT_CLINIC_OR_DEPARTMENT_OTHER): Payer: Self-pay | Attending: Family Medicine | Admitting: Internal Medicine

## 2018-01-15 ENCOUNTER — Other Ambulatory Visit: Payer: Self-pay | Admitting: Family Medicine

## 2018-01-15 ENCOUNTER — Encounter: Payer: Self-pay | Admitting: Family Medicine

## 2018-01-15 ENCOUNTER — Ambulatory Visit: Payer: Self-pay | Attending: Family Medicine | Admitting: Family Medicine

## 2018-01-15 VITALS — Ht 63.0 in | Wt 239.0 lb

## 2018-01-15 VITALS — BP 128/84 | HR 49 | Temp 98.2°F | Resp 16 | Ht 63.0 in | Wt 240.4 lb

## 2018-01-15 DIAGNOSIS — Z113 Encounter for screening for infections with a predominantly sexual mode of transmission: Secondary | ICD-10-CM | POA: Insufficient documentation

## 2018-01-15 DIAGNOSIS — B009 Herpesviral infection, unspecified: Secondary | ICD-10-CM | POA: Insufficient documentation

## 2018-01-15 DIAGNOSIS — Z01419 Encounter for gynecological examination (general) (routine) without abnormal findings: Secondary | ICD-10-CM

## 2018-01-15 DIAGNOSIS — Z79899 Other long term (current) drug therapy: Secondary | ICD-10-CM | POA: Insufficient documentation

## 2018-01-15 DIAGNOSIS — R0681 Apnea, not elsewhere classified: Secondary | ICD-10-CM | POA: Insufficient documentation

## 2018-01-15 DIAGNOSIS — R001 Bradycardia, unspecified: Secondary | ICD-10-CM | POA: Insufficient documentation

## 2018-01-15 DIAGNOSIS — N63 Unspecified lump in unspecified breast: Secondary | ICD-10-CM | POA: Insufficient documentation

## 2018-01-15 DIAGNOSIS — I10 Essential (primary) hypertension: Secondary | ICD-10-CM | POA: Insufficient documentation

## 2018-01-15 DIAGNOSIS — T50905A Adverse effect of unspecified drugs, medicaments and biological substances, initial encounter: Secondary | ICD-10-CM

## 2018-01-15 MED ORDER — HYDROCHLOROTHIAZIDE 25 MG PO TABS: 25.0000 mg | ORAL_TABLET | Freq: Every day | ORAL | 2 refills | Status: DC

## 2018-01-15 NOTE — Progress Notes (Signed)
Patient is here for pap and blood pressure check.  Patient stated her right knee are in pain for 2 months.

## 2018-01-15 NOTE — Progress Notes (Signed)
Subjective:  Patient ID: Natalie Cervantes, female    DOB: [birth year]  Age: 41 y.o. MRN: [id number]  CC: Abnormal Pap Smear and Blood Pressure Check   HPI Natalie Cervantes presents for well woman visit with pap. She denies  family history of breast cancer. She denies family history of  gynecological cancers. She  is not a current smoker.  She denies any lumps, denting or dimpling of the breast, or nipple discharge. She does not perform monthly SBE. She denies / reports vaginal lesions, discharge, or dysuria. She is agreeable to STI testing with pap. She reports 1 partner within last 3 months.   Outpatient Medications Prior to Visit  Medication Sig Dispense Refill  . ferrous sulfate 325 (65 FE) MG tablet Take 1 tablet (325 mg total) by mouth 3 (three) times daily with meals. 90 tablet 3  . ibuprofen (ADVIL,MOTRIN) 600 MG tablet Take 1 tablet (600 mg total) by mouth every 8 (eight) hours as needed. Take with food. 30 tablet 0  . amLODipine (NORVASC) 5 MG tablet Take 1 tablet (5 mg total) by mouth daily. 30 tablet 2  . Elastic Bandages & Supports (KNEE BRACE) MISC APPLY ONE BRACE TO RIGHT KNEE FOR STABILITY AND SUPPORT. TO BE FITTED BY MEDICAL SUPPLY. (Patient not taking: Reported on 01/15/2018) 1 each 0  . guaiFENesin-dextromethorphan (ROBITUSSIN DM) 100-10 MG/5ML syrup Take 5 mLs by mouth every 4 (four) hours as needed for cough. (Patient not taking: Reported on 01/15/2018) 236 mL 0  . Norgestimate-Ethinyl Estradiol Triphasic (ORTHO TRI-CYCLEN LO) 0.18/0.215/0.25 MG-25 MCG tab Take 1 tablet by mouth daily. (Patient not taking: Reported on 01/15/2018) 1 Package 11   No facility-administered medications prior to visit.     ROS Review of Systems  Constitutional: Negative.   Respiratory: Negative.   Cardiovascular: Negative.   Gastrointestinal: Negative.   Genitourinary: Negative.   Neurological: Negative for dizziness, syncope, weakness and light-headedness.  Psychiatric/Behavioral: Negative.      Objective:  BP 128/84 (BP Location: Right Arm, Patient Position: Sitting, Cuff Size: Normal)   Pulse (!) 49   Temp 98.2 F (36.8 C) (Oral)   Resp 16   Ht 5\' 3"  (1.6 m)   Wt 240 lb 6.4 oz (109 kg)   LMP 12/24/2017 (Approximate)   SpO2 100%   BMI 42.58 kg/m   BP/Weight 01/15/2018 01/15/2018 1/74/9449  Systolic BP - 675 916  Diastolic BP - 84 82  Wt. (Lbs) 239 240.4 240  BMI 42.34 42.58 41.2    Physical Exam  Constitutional: She appears well-developed and well-nourished.  Cardiovascular: Normal rate, regular rhythm, normal heart sounds and intact distal pulses.  Pulmonary/Chest: Effort normal and breath sounds normal. Right breast exhibits mass (lumpy breasts). Right breast exhibits no nipple discharge, no skin change and no tenderness. Left breast exhibits mass (lumpy breasts). Left breast exhibits no nipple discharge, no skin change and no tenderness.  Abdominal: Soft. Bowel sounds are normal.  Genitourinary: Vagina normal. Cervix exhibits no discharge.  Skin: Skin is warm and dry.  Psychiatric: She has a normal mood and affect.  Nursing note and vitals reviewed.   Assessment & Plan:   1. Well woman exam with routine gynecological exam  - Cytology - PAP([facility])  2. Screening for STDs (sexually transmitted diseases)  - Cytology - PAP([facility]) - Cervicovaginal ancillary only - HEP, RPR, HIV Panel - HSV(herpes simplex vrs) 1+2 ab-IgG  3. Essential hypertension D/c amlodipine due to low pulse rate. Will order hctz. Schedule recheck in 1  week for HR/BP. - hydrochlorothiazide (HYDRODIURIL) 25 MG tablet; Take 1 tablet (25 mg total) by mouth daily.  Dispense: 30 tablet; Refill: 2  4. Breast lump in female  - US BREAST COMPLETE UNI LEFT INC AXILLA; Future - US BREAST COMPLETE UNI RIGHT INC AXILLA; Future  5. Bradycardia, drug induced D/c amlodipine due to low pulse rate.     Follow-up: Return in about 1 week (around 01/22/2018) for BP/HR check with Carilyn Goodpasture or  [place].   Alfonse Spruce FNP

## 2018-01-15 NOTE — Patient Instructions (Signed)
Breast Self-Awareness Breast self-awareness means:  Knowing how your breasts look.  Knowing how your breasts feel.  Checking your breasts every month for changes.  Telling your doctor if you notice a change in your breasts.  Breast self-awareness allows you to notice a breast problem early while it is still small. How to do a breast self-exam One way to learn what is normal for your breasts and to check for changes is to do a breast self-exam. To do a breast self-exam: Look for Changes  1. Take off all the clothes above your waist. 2. Stand in front of a mirror in a room with good lighting. 3. Put your hands on your hips. 4. Push your hands down. 5. Look at your breasts and nipples in the mirror to see if one breast or nipple looks different than the other. Check to see if: ? The shape of one breast is different. ? The size of one breast is different. ? There are wrinkles, dips, and bumps in one breast and not the other. 6. Look at each breast for changes in your skin, such as: ? Redness. ? Scaly areas. 7. Look for changes in your nipples, such as: ? Liquid around the nipples. ? Bleeding. ? Dimpling. ? Redness. ? A change in where the nipples are. Feel for Changes 1. Lie on your back on the floor. 2. Feel each breast. To do this, follow these steps: ? Pick a breast to feel. ? Put the arm closest to that breast above your head. ? Use your other arm to feel the nipple area of your breast. Feel the area with the pads of your three middle fingers by making small circles with your fingers. For the first circle, press lightly. For the second circle, press harder. For the third circle, press even harder. ? Keep making circles with your fingers at the light, harder, and even harder pressures as you move down your breast. Stop when you feel your ribs. ? Move your fingers a little toward the center of your body. ? Start making circles with your fingers again, this time going up until  you reach your collarbone. ? Keep making up and down circles until you reach your armpit. Remember to keep using the three pressures. ? Feel the other breast in the same way. 3. Sit or stand in the shower or tub. 4. With soapy water on your skin, feel each breast the same way you did in step 2, when you were lying on the floor. Write Down What You Find  After doing the self-exam, write down:  What is normal for each breast.  Any changes you find in each breast.  When you last had your period.  How often should I check my breasts? Check your breasts every month. If you are breastfeeding, the best time to check them is after you feed your baby or after you use a breast pump. If you get periods, the best time to check your breasts is 5-7 days after your period is over. When should I see my doctor? See your doctor if you notice:  A change in shape or size of your breasts or nipples.  A change in the skin of your breast or nipples, such as red or scaly skin.  Unusual fluid coming from your nipples.  A lump or thick area that was not there before.  Pain in your breasts.  Anything that concerns you.  This information is not intended to replace advice given to   you by your health care provider. Make sure you discuss any questions you have with your health care provider. Document Released: 05/12/2008 Document Revised: 05/01/2016 Document Reviewed: 10/14/2015 Elsevier Interactive Patient Education  2018 [employer].    Pap Test Why am I having this test? A pap test is sometimes called a pap smear. It is a screening test that is used to check for signs of cancer of the vagina, cervix, and uterus. The test can also identify the presence of infection or precancerous changes. Your health care provider will likely recommend you have this test done on a regular basis. This test may be done:  Every 3 years, starting at age 44.  Every 5 years, in combination with testing for the presence  of human papillomavirus (HPV).  More or less often depending on other medical conditions.  What kind of sample is taken? Using a small cotton swab, plastic spatula, or brush, your health care provider will collect a sample of cells from the surface of your cervix. Your cervix is the opening to your uterus, also called a womb. Secretions from the cervix and vagina may also be collected. How do I prepare for this test?  Be aware of where you are in your menstrual cycle. You may be asked to reschedule the test if you are menstruating on the day of the test.  You may need to reschedule if you have a known vaginal infection on the day of the test.  You may be asked to avoid douching or taking a bath the day before or the day of the test.  Some medicines can cause abnormal test results, such as digitalis and tetracycline. Talk with your health care provider before your test if you take one of these medicines. What do the results mean? Abnormal test results may indicate a number of health conditions. These may include:  Cancer. Although pap test results cannot be used to diagnose cancer of the cervix, vagina, or uterus, they may suggest the possibility of cancer. Further tests would be required to determine if cancer is present.  Sexually transmitted disease.  Fungal infection.  Parasite infection.  Herpes infection.  A condition causing or contributing to infertility.  It is your responsibility to obtain your test results. Ask the lab or department performing the test when and how you will get your results. Contact your health care provider to discuss any questions you have about your results. Talk with your health care provider to discuss your results, treatment options, and if necessary, the need for more tests. Talk with your health care provider if you have any questions about your results. This information is not intended to replace advice given to you by your health care provider.  Make sure you discuss any questions you have with your health care provider. Document Released: 02/14/2003 Document Revised: 07/30/2016 Document Reviewed: 04/17/2014 Elsevier Interactive Patient Education  [employer].

## 2018-01-18 ENCOUNTER — Encounter: Payer: Self-pay | Admitting: Obstetrics and Gynecology

## 2018-01-18 LAB — CERVICOVAGINAL ANCILLARY ONLY
Bacterial vaginitis: NEGATIVE
Candida vaginitis: NEGATIVE
Chlamydia: NEGATIVE
NEISSERIA GONORRHEA: NEGATIVE
Trichomonas: NEGATIVE

## 2018-01-19 LAB — CYTOLOGY - PAP
ADEQUACY: ABSENT
Diagnosis: NEGATIVE
HPV (WINDOPATH): NOT DETECTED

## 2018-01-21 ENCOUNTER — Telehealth (INDEPENDENT_AMBULATORY_CARE_PROVIDER_SITE_OTHER): Payer: Self-pay | Admitting: *Deleted

## 2018-01-21 LAB — CERVICOVAGINAL ANCILLARY ONLY: Herpes: NEGATIVE

## 2018-01-21 NOTE — Telephone Encounter (Signed)
Patient verified DOB Patient is aware of PAP being normal and all STI's being negative. No further questions.

## 2018-01-21 NOTE — Telephone Encounter (Signed)
-----   Message from Alfonse Spruce, [place] sent at 01/20/2018 11:49 AM EST ----- Pap smear showed no lesions or malignancy. Gonorrhea, Chlamydia, BV, Yeast and Trichomonas were all negative.

## 2018-01-23 DIAGNOSIS — R0681 Apnea, not elsewhere classified: Secondary | ICD-10-CM

## 2018-01-23 NOTE — Procedures (Signed)
    Patient Name: Natalie Cervantes, Natalie Cervantes Date: 01/15/2018 Gender: Female D.O.B: Jun 11, 1976 Age (years): 26 Referring Provider: Alfonse Spruce FNP Height (inches): 63 Interpreting Physician: Baird Lyons MD, ABSM Weight (lbs): 239 RPSGT: Earney [place] BMI: 42 MRN: [id number] Neck Size: 16.00 <br> <br> CLINICAL INFORMATION Sleep Study Type: NPSG  Indication for sleep study: fatigue  Epworth Sleepiness Score:  1  SLEEP STUDY TECHNIQUE As per the AASM Manual for the Scoring of Sleep and Associated Events v2.3 (April 2016) with a hypopnea requiring 4% desaturations.  The channels recorded and monitored were frontal, central and occipital EEG, electrooculogram (EOG), submentalis EMG (chin), nasal and oral airflow, thoracic and abdominal wall motion, anterior tibialis EMG, snore microphone, electrocardiogram, and pulse oximetry.  MEDICATIONS Medications self-administered by patient taken the night of the study : none reported  SLEEP ARCHITECTURE The study was initiated at 10:47:46 PM and ended at 4:57:57 AM.  Sleep onset time was 25.9 minutes and the sleep efficiency was 79.4%. The total sleep time was 293.8 minutes.  Stage REM latency was 166.5 minutes.  The patient spent 1.70% of the night in stage N1 sleep, 75.83% in stage N2 sleep, 0.00% in stage N3 and 22.47% in REM.  Alpha intrusion was absent.  Supine sleep was 27.56%.  RESPIRATORY PARAMETERS The overall apnea/hypopnea index (AHI) was 0.0 per hour. There were 0 total apneas, including 0 obstructive, 0 central and 0 mixed apneas. There were 0 hypopneas and 0 RERAs.  The AHI during Stage REM sleep was 0.0 per hour.  AHI while supine was 0.0 per hour.  The mean oxygen saturation was 98.03%. The minimum SpO2 during sleep was 95.00%.   Little snoring was noted during this study.  CARDIAC DATA The 2 lead EKG demonstrated sinus rhythm. The mean heart rate was 50.17 beats per minute. Other EKG findings include:  None.  LEG MOVEMENT DATA The total PLMS were 0 with a resulting PLMS index of 0.00. Associated arousal with leg movement index was 0.0 .  IMPRESSIONS - No significant obstructive sleep apnea occurred during this study (AHI = 0.0/h). - No significant central sleep apnea occurred during this study (CAI = 0.0/h). - No oxygen desaturation was noted during this study (Min O2 = 95.00%). - No snoring was audible during this study. - No cardiac abnormalities were noted during this study. - Clinically significant periodic limb movements did not occur during sleep. No significant associated arousals.  DIAGNOSIS - Normal  RECOMMENDATIONS - Be careful with alcohol, sedatives and other CNS depressants that may worsen sleep apnea and disrupt normal sleep architecture. - Sleep hygiene should be reviewed to assess factors that may improve sleep quality. - Weight management and regular exercise should be initiated or continued if appropriate.  [Electronically signed] 01/23/2018 11:32 AM  Baird Lyons MD, ABSM Diplomate, American Board of Sleep Medicine   NPI: [id number]                         [place]  ELECTRONICALLY SIGNED ON:  01/23/2018, 11:28 AM [place] PH: [phone]   FX: [phone] [place]

## 2018-01-27 ENCOUNTER — Encounter: Payer: Self-pay | Admitting: Pharmacist

## 2018-02-01 ENCOUNTER — Telehealth: Payer: Self-pay | Admitting: Family Medicine

## 2018-02-01 NOTE — Telephone Encounter (Signed)
Patient called asking for lab results. Please call patient back

## 2018-02-02 NOTE — Telephone Encounter (Signed)
Patient was called and informed of lab results. 

## 2018-02-02 NOTE — Telephone Encounter (Signed)
Pt called again, for lab results

## 2018-02-22 ENCOUNTER — Encounter: Payer: Self-pay | Admitting: Obstetrics and Gynecology

## 2018-02-22 ENCOUNTER — Ambulatory Visit (INDEPENDENT_AMBULATORY_CARE_PROVIDER_SITE_OTHER): Payer: Self-pay | Admitting: Obstetrics and Gynecology

## 2018-02-22 VITALS — BP 122/74 | HR 57 | Ht 63.0 in | Wt 237.3 lb

## 2018-02-22 DIAGNOSIS — Z01419 Encounter for gynecological examination (general) (routine) without abnormal findings: Secondary | ICD-10-CM

## 2018-02-22 DIAGNOSIS — Z0000 Encounter for general adult medical examination without abnormal findings: Secondary | ICD-10-CM

## 2018-02-22 DIAGNOSIS — Z113 Encounter for screening for infections with a predominantly sexual mode of transmission: Secondary | ICD-10-CM

## 2018-02-22 DIAGNOSIS — D259 Leiomyoma of uterus, unspecified: Secondary | ICD-10-CM

## 2018-02-22 DIAGNOSIS — N920 Excessive and frequent menstruation with regular cycle: Secondary | ICD-10-CM

## 2018-02-22 DIAGNOSIS — Z3189 Encounter for other procreative management: Secondary | ICD-10-CM

## 2018-02-22 NOTE — Progress Notes (Signed)
GYNECOLOGY ANNUAL PREVENTATIVE CARE ENCOUNTER NOTE  Subjective:   Natalie Cervantes is a 42 y.o. [id number] female here for a annual gynecologic exam. Current complaints: heavy periods.  Having monthly periods, bleeds for 7 days. On heavy days (3-4 out of 7), is changing pads sometimes up to every 15 minutes. Has been heavier like this for about a year. Denies intermenstrual vaginal bleeding, discharge, pelvic pain, problems with intercourse or other gynecologic concerns.    Gynecologic History Patient's last menstrual period was 02/15/2018 (exact date). Contraception: none Last Pap: 01/15/18 Results were: negative Last mammogram: has not had  Obstetric History OB History  Gravida Para Term Preterm AB Living  7 3 3  0 4 3  SAB TAB Ectopic Multiple Live Births  4 0 0 0 1    # Outcome Date GA Lbr Len/2nd Weight Sex Delivery Anes PTL Lv  7 Term 07/18/12 [redacted]w[redacted]d 06:02 / 00:26 4 lb 12.9 oz (2.18 kg) F Vag-Spont EPI  LIV  6 SAB           5 SAB           4 SAB           3 SAB           2 Term           1 Term               Past Medical History:  Diagnosis Date  . No pertinent past medical history     Past Surgical History:  Procedure Laterality Date  . NO PAST SURGERIES      Current Outpatient Medications on File Prior to Visit  Medication Sig Dispense Refill  . ferrous sulfate 325 (65 FE) MG tablet Take 1 tablet (325 mg total) by mouth 3 (three) times daily with meals. 90 tablet 3  . hydrochlorothiazide (HYDRODIURIL) 25 MG tablet Take 1 tablet (25 mg total) by mouth daily. 30 tablet 2  . ibuprofen (ADVIL,MOTRIN) 600 MG tablet Take 1 tablet (600 mg total) by mouth every 8 (eight) hours as needed. Take with food. 30 tablet 0  . Elastic Bandages & Supports (KNEE BRACE) MISC APPLY ONE BRACE TO RIGHT KNEE FOR STABILITY AND SUPPORT. TO BE FITTED BY MEDICAL SUPPLY. (Patient not taking: Reported on 01/15/2018) 1 each 0   No current facility-administered medications on file prior to visit.      Not on File  Social History   Socioeconomic History  . Marital status: Single    Spouse name: Not on file  . Number of children: Not on file  . Years of education: Not on file  . Highest education level: Not on file  Social Needs  . Financial resource strain: Not on file  . Food insecurity - worry: Not on file  . Food insecurity - inability: Not on file  . Transportation needs - medical: Not on file  . Transportation needs - non-medical: Not on file  Occupational History  . Not on file  Tobacco Use  . Smoking status: Never Smoker  . Smokeless tobacco: Never Used  Substance and Sexual Activity  . Alcohol use: No  . Drug use: No  . Sexual activity: Yes    Birth control/protection: None  Other Topics Concern  . Not on file  Social History Narrative  . Not on file    No family history on file.  Diet: eating well, has lost 10 pounds Exercise: attends gym regularly  The following portions  of the patient's history were reviewed and updated as appropriate: allergies, current medications, past family history, past medical history, past social history, past surgical history and problem list.  Review of Systems Pertinent items are noted in HPI.   Objective:  BP 122/74   Pulse (!) 57   Ht 5\' 3"  (1.6 m)   Wt 237 lb 4.8 oz (107.6 kg)   LMP 02/15/2018 (Exact Date)   BMI 42.04 kg/m  CONSTITUTIONAL: Well-developed, well-nourished female in no acute distress.  HENT:  Normocephalic, atraumatic, External right and left ear normal. Oropharynx is clear and moist EYES: Conjunctivae and EOM are normal. Pupils are equal, round, and reactive to light. No scleral icterus.  NECK: Normal range of motion, supple, no masses.  Normal thyroid.  SKIN: Skin is warm and dry. No rash noted. Not diaphoretic. No erythema. No pallor. NEUROLOGIC: Alert and oriented to person, place, and time. Normal reflexes, muscle tone coordination. No cranial nerve deficit noted. PSYCHIATRIC: Normal mood and  affect. Normal behavior. Normal judgment and thought content. CARDIOVASCULAR: Normal heart rate noted, regular rhythm RESPIRATORY: Clear to auscultation bilaterally. Effort and breath sounds normal, no problems with respiration noted. BREASTS: Symmetric in size. Fibrocystic breasts bilaterally. No masses, skin changes, nipple drainage, or lymphadenopathy. ABDOMEN: Soft, normal bowel sounds, no distention noted.  No tenderness, rebound or guarding.  PELVIC: Normal appearing external genitalia; normal appearing vaginal mucosa and cervix.  No abnormal discharge noted. Normal uterine size, no other palpable masses, no uterine or adnexal tenderness. MUSCULOSKELETAL: Normal range of motion. No tenderness.  No cyanosis, clubbing, or edema.  2+ distal pulses.   Assessment and Plan:   1. Annual physical exam - Bilateral breast US scheduled through mammogram scholarship (ordered by Dr. Braulio Conte, fibrocystic breasts bilaterally, no specific lumps palpated on exam today)   2. Routine screening for STI (sexually transmitted infection) Negative GC/CT at PCP office - HIV antibody (with reflex) - RPR - Hepatitis B surface antigen - Hepatitis C Antibody  3. 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 She will consider it, not currently actively trying but would be happy if she were pregnant. Reviewed that options for controlling menorrhagia would preclude pregnancy and she is okay with using hormonal methods of controlling menorrhagia.  4. Uterine leiomyoma, unspecified location 3 small fibroids on TVUS 12/2017 No complaints Reviewed that would not recommend removal unless they were causing significant issues  5. Menorrhagia with regular cycle Recommended EMB given that  bleeding has become heavier in last year, patient agreeable to EMB Recommended hormonal control, patient previously started on OCPs but could not remember to take them. Reviewed IUD for hormonal control of bleeding with patient, she is agreeable Reviewed risks of EMB/IUD placement with patient, she verbalizes understanding Will return for EMB/IUD placement   Will follow up results of STI screen and manage accordingly. Encouraged improvement in diet and exercise.    Routine preventative health maintenance measures emphasized. Please refer to After Visit Summary for other counseling recommendations.    Feliz Beam, M.D. Attending [place], [facility] for [employer], [place]

## 2018-02-23 LAB — HEPATITIS C ANTIBODY: Hep C Virus Ab: <0.1 s/co ratio (ref 0.0–0.9)

## 2018-02-23 LAB — HIV ANTIBODY (ROUTINE TESTING W REFLEX): HIV SCREEN 4TH GENERATION: NONREACTIVE

## 2018-02-23 LAB — RPR: RPR Ser Ql: NONREACTIVE

## 2018-02-23 LAB — HEPATITIS B SURFACE ANTIGEN: Hepatitis B Surface Ag: NEGATIVE

## 2018-03-15 ENCOUNTER — Encounter: Payer: Self-pay | Admitting: Family Medicine

## 2018-03-15 ENCOUNTER — Other Ambulatory Visit (HOSPITAL_COMMUNITY)
Admission: RE | Admit: 2018-03-15 | Discharge: 2018-03-15 | Disposition: A | Discharge status: Home / Self Care | Payer: Self-pay | Source: Ambulatory Visit | Attending: Family Medicine | Admitting: Family Medicine

## 2018-03-15 ENCOUNTER — Encounter: Payer: Self-pay | Admitting: *Deleted

## 2018-03-15 ENCOUNTER — Ambulatory Visit: Payer: Self-pay | Admitting: Family Medicine

## 2018-03-15 VITALS — BP 126/67 | HR 51 | Ht 63.0 in | Wt 238.2 lb

## 2018-03-15 DIAGNOSIS — N920 Excessive and frequent menstruation with regular cycle: Secondary | ICD-10-CM | POA: Insufficient documentation

## 2018-03-15 DIAGNOSIS — D259 Leiomyoma of uterus, unspecified: Secondary | ICD-10-CM

## 2018-03-15 MED ORDER — IBUPROFEN 200 MG PO TABS
600.0000 mg | ORAL_TABLET | Freq: Once | ORAL | Status: AC
  Administered 2018-03-15: 600 mg via ORAL

## 2018-03-15 NOTE — Progress Notes (Signed)
   Subjective:    Patient ID: Natalie Cervantes is a 42 y.o. female presenting with abnormal bleeding  on 03/15/2018  HPI: Here today for EMB. Was to have IUD also, but does not have insurance and has not applied for assistance with the IUD. She is on her cycle today.  Review of Systems  Constitutional: Negative for chills and fever.  Respiratory: Negative for shortness of breath.   Cardiovascular: Negative for chest pain.  Gastrointestinal: Negative for abdominal pain, nausea and vomiting.  Genitourinary: Positive for pelvic pain and vaginal bleeding. Negative for dysuria.  Skin: Negative for rash.      Objective:    BP 126/67   Pulse (!) 51   Ht 5\' 3"  (1.6 m)   Wt 238 lb 3.2 oz (108 kg)   LMP 03/12/2018 (Exact Date)   BMI 42.20 kg/m  Physical Exam  Constitutional: She is oriented to person, place, and time. She appears well-developed and well-nourished. No distress.  HENT:  Head: Normocephalic and atraumatic.  Eyes: No scleral icterus.  Neck: Neck supple.  Cardiovascular: Normal rate.  Pulmonary/Chest: Effort normal.  Abdominal: Soft.  Neurological: She is alert and oriented to person, place, and time.  Skin: Skin is warm and dry.  Psychiatric: She has a normal mood and affect.   Procedure: Patient given informed consent, signed copy in the chart, time out was performed. Appropriate time out taken. . The patient was placed in the lithotomy position and the cervix brought into view with sterile speculum.  Portio of cervix cleansed x 2 with betadine swabs.  A tenaculum was placed in the anterior lip of the cervix.  The uterus was sounded for depth of 9 cm. A pipelle was introduced to into the uterus, suction created,  and an endometrial sample was obtained. Passed x 2, mostly blood returned. All equipment was removed and accounted for.  The patient tolerated the procedure well.   Patient given post procedure instructions.     Assessment & Plan:   Problem List Items Addressed  This Visit      Unprioritized   Uterine leiomyoma - Primary    Lengthy discussion with patient and partner around implications, long term management, pain and bleeding due to fibroids and treatment options.      Relevant Medications   ibuprofen (ADVIL,MOTRIN) tablet 600 mg (Completed)   Menorrhagia    Discussed how fibroids may play a role--failed OC's in the past--for IUD, application sent.      Relevant Medications   ibuprofen (ADVIL,MOTRIN) tablet 600 mg (Completed)   Other Relevant Orders   Surgical pathology      Total face-to-face time with patient: 10 minutes. Over 50% of encounter was spent on counseling and coordination of care. Return in about 1 month (around 04/12/2018) for iud insertion.  Donnamae Jude 03/15/2018 10:23 AM

## 2018-03-15 NOTE — Assessment & Plan Note (Signed)
Discussed how fibroids may play a role--failed OC's in the past--for IUD, application sent.

## 2018-03-15 NOTE — Patient Instructions (Signed)

## 2018-03-15 NOTE — Assessment & Plan Note (Signed)
Lengthy discussion with patient and partner around implications, long term management, pain and bleeding due to fibroids and treatment options.

## 2018-03-17 ENCOUNTER — Ambulatory Visit: Payer: Self-pay | Admitting: Audiology

## 2018-03-22 ENCOUNTER — Telehealth: Payer: Self-pay | Admitting: General Practice

## 2018-03-22 NOTE — Telephone Encounter (Signed)
Called and informed patient of results. Patient verbalized understanding & had no questions

## 2018-03-22 NOTE — Telephone Encounter (Signed)
-----   Message from Donnamae Jude, MD sent at 03/17/2018  4:55 PM EDT ----- Her biopsy was normal--not on MyChart--please inform her

## 2018-04-15 ENCOUNTER — Ambulatory Visit: Payer: Self-pay | Attending: Family Medicine | Admitting: Family Medicine

## 2018-04-15 ENCOUNTER — Encounter: Payer: Self-pay | Admitting: Family Medicine

## 2018-04-15 DIAGNOSIS — I10 Essential (primary) hypertension: Secondary | ICD-10-CM | POA: Insufficient documentation

## 2018-04-15 DIAGNOSIS — M25561 Pain in right knee: Secondary | ICD-10-CM | POA: Insufficient documentation

## 2018-04-15 DIAGNOSIS — E669 Obesity, unspecified: Secondary | ICD-10-CM | POA: Insufficient documentation

## 2018-04-15 DIAGNOSIS — Z6841 Body Mass Index (BMI) 40.0 and over, adult: Secondary | ICD-10-CM | POA: Insufficient documentation

## 2018-04-15 DIAGNOSIS — Z79899 Other long term (current) drug therapy: Secondary | ICD-10-CM | POA: Insufficient documentation

## 2018-04-15 DIAGNOSIS — N920 Excessive and frequent menstruation with regular cycle: Secondary | ICD-10-CM | POA: Insufficient documentation

## 2018-04-15 DIAGNOSIS — G8929 Other chronic pain: Secondary | ICD-10-CM | POA: Insufficient documentation

## 2018-04-15 DIAGNOSIS — D500 Iron deficiency anemia secondary to blood loss (chronic): Secondary | ICD-10-CM | POA: Insufficient documentation

## 2018-04-15 DIAGNOSIS — D259 Leiomyoma of uterus, unspecified: Secondary | ICD-10-CM | POA: Insufficient documentation

## 2018-04-15 MED ORDER — HYDROCHLOROTHIAZIDE 25 MG PO TABS: 25.0000 mg | ORAL_TABLET | Freq: Every day | ORAL | 6 refills | Status: DC

## 2018-04-15 MED ORDER — IBUPROFEN 600 MG PO TABS: 600.0000 mg | ORAL_TABLET | Freq: Three times a day (TID) | ORAL | 2 refills | Status: DC | PRN

## 2018-04-15 MED ORDER — FERROUS SULFATE 325 (65 FE) MG PO TABS: 325.0000 mg | ORAL_TABLET | Freq: Three times a day (TID) | ORAL | 3 refills | Status: DC

## 2018-04-15 NOTE — Progress Notes (Signed)
Subjective:  Patient ID: Natalie Cervantes, female    DOB: 05-Feb-1976  Age: 42 y.o. MRN: [id number]  CC: Hypertension   HPI Natalie Cervantes is a 42 year old female with a history of hypertension, iron deficiency anemia secondary to menorrhagia from uterine fibroids, obesity who presents today to establish care with me as she was previously followed by Fredia Beets, FNP who is no longer with the practice.  She endorses compliance with her antihypertensive and denies adverse effect.  Also adherent with a low sodium, DASH diet  and goes to the gym on some occasions but not regularly. She reports her mood menorrhagia to be better occurring on 3 to 4 days a month and not as heavy over the last 2 months as opposed to previous heavy bleed lasting about 1 week/month.  Previously seen by GYN and given options of IUD placement versus surgery however she is undecided; takes ferrous sulfate and denies constipation. She takes ibuprofen intermittently for chronic right knee pain which she states is stable at this time her symptoms are controlled on ibuprofen. She denies additional concerns at this time.   Past Medical History:  Diagnosis Date  . No pertinent past medical history     Past Surgical History:  Procedure Laterality Date  . NO PAST SURGERIES      Not on File   Outpatient Medications Prior to Visit  Medication Sig Dispense Refill  . ferrous sulfate 325 (65 FE) MG tablet Take 1 tablet (325 mg total) by mouth 3 (three) times daily with meals. 90 tablet 3  . hydrochlorothiazide (HYDRODIURIL) 25 MG tablet Take 1 tablet (25 mg total) by mouth daily. 30 tablet 2  . ibuprofen (ADVIL,MOTRIN) 600 MG tablet Take 1 tablet (600 mg total) by mouth every 8 (eight) hours as needed. Take with food. 30 tablet 0  . Elastic Bandages & Supports (KNEE BRACE) MISC APPLY ONE BRACE TO RIGHT KNEE FOR STABILITY AND SUPPORT. TO BE FITTED BY MEDICAL SUPPLY. (Patient not taking: Reported on 01/15/2018) 1 each 0   No  facility-administered medications prior to visit.     ROS Review of Systems  Constitutional: Negative for activity change, appetite change and fatigue.  HENT: Negative for congestion, sinus pressure and sore throat.   Eyes: Negative for visual disturbance.  Respiratory: Negative for cough, chest tightness, shortness of breath and wheezing.   Cardiovascular: Negative for chest pain and palpitations.  Gastrointestinal: Negative for abdominal distention, abdominal pain and constipation.  Endocrine: Negative for polydipsia.  Genitourinary: Negative for dysuria and frequency.  Musculoskeletal: Negative for arthralgias and back pain.  Skin: Negative for rash.  Neurological: Negative for tremors, light-headedness and numbness.  Hematological: Does not bruise/bleed easily.  Psychiatric/Behavioral: Negative for agitation and behavioral problems.    Objective:  BP 105/69   Pulse (!) 49   Temp 97.6 F (36.4 C) (Oral)   Ht 5\' 3"  (1.6 m)   Wt 239 lb 9.6 oz (108.7 kg)   SpO2 100%   BMI 42.44 kg/m   BP/Weight 04/15/2018 03/15/2018 06/22/9677  Systolic BP 938 101 751  Diastolic BP 69 67 74  Wt. (Lbs) 239.6 238.2 237.3  BMI 42.44 42.2 42.04      Physical Exam  Constitutional: She is oriented to person, place, and time. She appears well-developed and well-nourished.  Obese  Cardiovascular: Normal rate, normal heart sounds and intact distal pulses.  No murmur heard. Pulmonary/Chest: Effort normal and breath sounds normal. She has no wheezes. She has no rales. She exhibits no  tenderness.  Abdominal: Soft. Bowel sounds are normal. She exhibits no distension and no mass. There is no tenderness.  Musculoskeletal: Normal range of motion.  Neurological: She is alert and oriented to person, place, and time.  Skin: Skin is warm and dry.  Psychiatric: She has a normal mood and affect.     CMP Latest Ref Rng & Units 12/31/2017 12/18/2017  Glucose 65 - 99 mg/dL 105(H) 103(H)  BUN 6 - 24 mg/dL 9 8    Creatinine 0.57 - 1.00 mg/dL 0.87 0.90  Sodium 134 - 144 mmol/L 138 140  Potassium 3.5 - 5.2 mmol/L 4.4 4.0  Chloride 96 - 106 mmol/L 100 103  CO2 20 - 29 mmol/L 23 23  Calcium 8.7 - 10.2 mg/dL 9.3 9.3  Total Protein 6.0 - 8.5 g/dL 7.0 -  Total Bilirubin 0.0 - 1.2 mg/dL 0.3 -  Alkaline Phos 39 - 117 IU/L 55 -  AST 0 - 40 IU/L 15 -  ALT 0 - 32 IU/L 11 -    Lipid Panel     Component Value Date/Time   CHOL 180 12/31/2017 0946   TRIG 68 12/31/2017 0946   HDL 66 12/31/2017 0946   CHOLHDL 2.7 12/31/2017 0946   LDLCALC 100 (H) 12/31/2017 0946     CBC    Component Value Date/Time   WBC 5.8 12/31/2017 0946   WBC 11.7 (H) 07/19/2012 0512   RBC 3.89 12/31/2017 0946   RBC 4.08 07/19/2012 0512   HGB 7.1 (L) 12/31/2017 0946   HCT 25.5 (L) 12/31/2017 0946   PLT 480 (H) 12/31/2017 0946   MCV 66 (L) 12/31/2017 0946   MCH 18.3 (L) 12/31/2017 0946   MCH 28.2 07/19/2012 0512   MCHC 27.8 (L) 12/31/2017 0946   MCHC 33.1 07/19/2012 0512   RDW 24.6 (H) 12/31/2017 0946     Assessment & Plan:   1. Essential hypertension Counseled on blood pressure goal of less than 130/80, low-sodium, DASH diet, medication compliance, 150 minutes of moderate intensity exercise per week. Discussed medication compliance, adverse effects. - Basic Metabolic Panel - hydrochlorothiazide (HYDRODIURIL) 25 MG tablet; Take 1 tablet (25 mg total) by mouth daily.  Dispense: 30 tablet; Refill: 6  2. Chronic pain of right knee Advised that weight loss will be beneficial - ibuprofen (ADVIL,MOTRIN) 600 MG tablet; Take 1 tablet (600 mg total) by mouth every 8 (eight) hours as needed. Take with food.  Dispense: 60 tablet; Refill: 2  3. Iron deficiency anemia due to chronic blood loss Last hemoglobin was 7.6 - CBC with Differential/Platelet - ferrous sulfate 325 (65 FE) MG tablet; Take 1 tablet (325 mg total) by mouth 3 (three) times daily with meals.  Dispense: 90 tablet; Refill: 3  4. Menorrhagia with regular  cycle She is still undecided about long-term management options - ferrous sulfate 325 (65 FE) MG tablet; Take 1 tablet (325 mg total) by mouth 3 (three) times daily with meals.  Dispense: 90 tablet; Refill: 3  5. Class 3 severe obesity due to excess calories without serious comorbidity with body mass index (BMI) of 40.0 to 44.9 in adult [facility]) Discussed moderate intensity exercise of 150 minutes/week, avoiding late meals, reducing portion sizes   Meds ordered this encounter  Medications  . ibuprofen (ADVIL,MOTRIN) 600 MG tablet    Sig: Take 1 tablet (600 mg total) by mouth every 8 (eight) hours as needed. Take with food.    Dispense:  60 tablet    Refill:  2  D/c previous script.  . hydrochlorothiazide (HYDRODIURIL) 25 MG tablet    Sig: Take 1 tablet (25 mg total) by mouth daily.    Dispense:  30 tablet    Refill:  6  . ferrous sulfate 325 (65 FE) MG tablet    Sig: Take 1 tablet (325 mg total) by mouth 3 (three) times daily with meals.    Dispense:  90 tablet    Refill:  3    Follow-up: Return in about 6 months (around 10/16/2018) for follow up of chronic medical conditions.   Charlott Rakes MD

## 2018-04-16 LAB — CBC WITH DIFFERENTIAL/PLATELET
Basophils Absolute: 0 10*3/uL (ref 0.0–0.2)
Basos: 1 %
EOS (ABSOLUTE): 0.1 10*3/uL (ref 0.0–0.4)
Eos: 1 %
HEMOGLOBIN: 11.1 g/dL (ref 11.1–15.9)
Hematocrit: 34.1 % (ref 34.0–46.6)
IMMATURE GRANS (ABS): 0 10*3/uL (ref 0.0–0.1)
Immature Granulocytes: 0 %
LYMPHS ABS: 1.9 10*3/uL (ref 0.7–3.1)
LYMPHS: 37 %
MCH: 26.7 pg (ref 26.6–33.0)
MCHC: 32.6 g/dL (ref 31.5–35.7)
MCV: 82 fL (ref 79–97)
MONOCYTES: 12 %
Monocytes Absolute: 0.6 10*3/uL (ref 0.1–0.9)
NEUTROS ABS: 2.5 10*3/uL (ref 1.4–7.0)
Neutrophils: 49 %
PLATELETS: 291 10*3/uL (ref 150–379)
RBC: 4.15 x10E6/uL (ref 3.77–5.28)
RDW: 16 % — ABNORMAL HIGH (ref 12.3–15.4)
WBC: 5 10*3/uL (ref 3.4–10.8)

## 2018-04-16 LAB — BASIC METABOLIC PANEL
BUN / CREAT RATIO: 12 (ref 9–23)
BUN: 9 mg/dL (ref 6–24)
CO2: 26 mmol/L (ref 20–29)
Calcium: 9.1 mg/dL (ref 8.7–10.2)
Chloride: 102 mmol/L (ref 96–106)
Creatinine, Ser: 0.77 mg/dL (ref 0.57–1.00)
GFR calc Af Amer: 109 mL/min/{1.73_m2} (ref >59)
GFR calc non Af Amer: 95 mL/min/{1.73_m2} (ref >59)
GLUCOSE: 96 mg/dL (ref 65–99)
Potassium: 3.9 mmol/L (ref 3.5–5.2)
SODIUM: 140 mmol/L (ref 134–144)

## 2018-04-20 ENCOUNTER — Telehealth: Payer: Self-pay

## 2018-04-20 NOTE — Telephone Encounter (Signed)
Patient was called and informed of normal labs. 

## 2018-05-25 ENCOUNTER — Ambulatory Visit: Payer: Self-pay | Attending: Family Medicine | Admitting: *Deleted

## 2018-05-25 DIAGNOSIS — R7611 Nonspecific reaction to tuberculin skin test without active tuberculosis: Secondary | ICD-10-CM

## 2018-05-25 DIAGNOSIS — Z111 Encounter for screening for respiratory tuberculosis: Secondary | ICD-10-CM

## 2018-05-25 NOTE — Progress Notes (Signed)
PPD Placement note Natalie Cervantes, 42 y.o. female is here today for placement of PPD test Reason for PPD test: employment    Pt taken PPD test before: yes Verified in allergy area and with patient that they are not allergic to the products PPD is made of (Phenol or Tween). Yes Is patient taking any oral or IV steroid medication now or have they taken it in the last month? no Has the patient ever received the BCG vaccine?: no Has the patient been in recent contact with anyone known or suspected of having active TB disease?: no     P: PPD placed on 05/25/2018.  Patient advised to return for reading within 48-72 hours.

## 2018-05-28 LAB — TB SKIN TEST: TB SKIN TEST: POSITIVE

## 2018-06-03 ENCOUNTER — Telehealth: Payer: Self-pay

## 2018-06-03 LAB — QUANTIFERON-TB GOLD PLUS
QuantiFERON Mitogen Value: >10 IU/mL
QuantiFERON Nil Value: 0.19 IU/mL
QuantiFERON TB1 Ag Value: 0.16 IU/mL
QuantiFERON TB2 Ag Value: 0.16 IU/mL
QuantiFERON-TB Gold Plus: NEGATIVE

## 2018-06-03 NOTE — Telephone Encounter (Signed)
Contacted pt to go over lab results pt is aware and will be calling pt tomorrow once letter is ready

## 2018-06-07 ENCOUNTER — Telehealth: Payer: Self-pay | Admitting: Family Medicine

## 2018-06-07 NOTE — Telephone Encounter (Signed)
Patient called and requested for an update regarding her TB letter for her job. Please fu at your earliest convenience.

## 2018-06-07 NOTE — Telephone Encounter (Signed)
Contacted pt and made aware of letter being ready pt states she would like the letter mailed

## 2019-02-19 ENCOUNTER — Other Ambulatory Visit: Payer: Self-pay | Admitting: Family Medicine

## 2019-02-19 DIAGNOSIS — I10 Essential (primary) hypertension: Secondary | ICD-10-CM

## 2019-02-22 ENCOUNTER — Other Ambulatory Visit: Payer: Self-pay | Admitting: Family Medicine

## 2019-02-22 ENCOUNTER — Telehealth: Payer: Self-pay | Admitting: Family Medicine

## 2019-02-22 DIAGNOSIS — I10 Essential (primary) hypertension: Secondary | ICD-10-CM

## 2019-02-22 NOTE — Telephone Encounter (Signed)
1) Medication(s) Requested (by name): hydrochlorothiazide (HYDRODIURIL) 25 MG tablet  2) Pharmacy of Choice: [place], [place] - [address]  3) Special Requests: Pt would like enough supply until next appt  Approved medications will be sent to the pharmacy, we will reach out if there is an issue.  Requests made after 3pm may not be addressed until the following business day!  If a patient is unsure of the name of the medication(s) please note and ask patient to call back when they are able to provide all info, do not send to responsible party until all information is available!

## 2019-02-23 NOTE — Telephone Encounter (Signed)
Refills given.

## 2019-04-07 ENCOUNTER — Ambulatory Visit: Payer: Self-pay | Attending: Family Medicine | Admitting: Family Medicine

## 2019-04-07 ENCOUNTER — Other Ambulatory Visit: Payer: Self-pay

## 2019-04-07 DIAGNOSIS — G8929 Other chronic pain: Secondary | ICD-10-CM

## 2019-04-07 DIAGNOSIS — M25562 Pain in left knee: Secondary | ICD-10-CM

## 2019-04-07 DIAGNOSIS — M25561 Pain in right knee: Secondary | ICD-10-CM

## 2019-04-07 DIAGNOSIS — I10 Essential (primary) hypertension: Secondary | ICD-10-CM

## 2019-04-07 MED ORDER — IBUPROFEN 600 MG PO TABS: 600.0000 mg | ORAL_TABLET | Freq: Three times a day (TID) | ORAL | 2 refills | Status: DC | PRN

## 2019-04-07 MED ORDER — HYDROCHLOROTHIAZIDE 25 MG PO TABS: 25.0000 mg | ORAL_TABLET | Freq: Every day | ORAL | 1 refills | Status: DC

## 2019-04-07 NOTE — Progress Notes (Signed)
Virtual Visit via Telephone Note  I connected with Natalie Cervantes, on 04/07/2019 at 9:09 AM by telephone and verified that I am speaking with the correct person using two identifiers.   Consent: I discussed the limitations, risks, security and privacy concerns of performing an evaluation and management service by telephone and the availability of in person appointments. I also discussed with the patient that there may be a patient responsible charge related to this service. The patient expressed understanding and agreed to proceed.   Location of Patient: Home  Location of Provider: Clinic   Persons participating in Telemedicine visit: Kenora Michaelis  Ozzie Hoyle Dr. Felecia Shelling     History of Present Illness: Natalie Cervantes is a 43 year old female with a history of hypertension, iron deficiency anemia secondary to menorrhagia from uterine fibroids, obesity seen for follow-up visit. Her last office visit at the clinic was in May 04/2018. She is requesting refills of her antihypertensive and denies chest pain or dyspnea. She complains of bilateral knee pain which alternates from left to right and presently is located in the left knee, described as a 7/10 with intermittent swelling.  She has run out of ibuprofen which she previously took for pain with improvement in symptoms.  Pain does not radiate.   Past Medical History:  Diagnosis Date  . No pertinent past medical history    No Known Allergies  Current Outpatient Medications on File Prior to Visit  Medication Sig Dispense Refill  . ferrous sulfate 325 (65 FE) MG tablet Take 1 tablet (325 mg total) by mouth 3 (three) times daily with meals. 90 tablet 3  . hydrochlorothiazide (HYDRODIURIL) 25 MG tablet Take 1 tablet (25 mg total) by mouth daily. MUST KEEP APPT ON 04/07/19 FOR FURTHER REFILLS 30 tablet 1  . ibuprofen (ADVIL,MOTRIN) 600 MG tablet Take 1 tablet (600 mg total) by mouth every 8 (eight) hours as needed. Take with  food. 60 tablet 2  . Elastic Bandages & Supports (KNEE BRACE) MISC APPLY ONE BRACE TO RIGHT KNEE FOR STABILITY AND SUPPORT. TO BE FITTED BY MEDICAL SUPPLY. (Patient not taking: Reported on 01/15/2018) 1 each 0   No current facility-administered medications on file prior to visit.     Observations/Objective: Awake, alert, oriented x3 Not in acute distress  Assessment and Plan: 1. Essential hypertension Keep blood pressure log at home Counseled on blood pressure goal of less than 130/80, low-sodium, DASH diet, medication compliance, 150 minutes of moderate intensity exercise per week. Discussed medication compliance, adverse effects. - hydrochlorothiazide (HYDRODIURIL) 25 MG tablet; Take 1 tablet (25 mg total) by mouth daily.  Dispense: 90 tablet; Refill: 1 - CMP14+EGFR - Lipid panel  2. Chronic pain of both knees Weight loss will be beneficial Refilled ibuprofen Advised to obtain knee brace If symptoms persist will consider physical therapy, x-ray and possible referral for corticosteroid injections - ibuprofen (ADVIL) 600 MG tablet; Take 1 tablet (600 mg total) by mouth every 8 (eight) hours as needed. Take with food.  Dispense: 60 tablet; Refill: 2   Follow Up Instructions: Return in about 3 months (around 07/07/2019).    I discussed the assessment and treatment plan with the patient. The patient was provided an opportunity to ask questions and all were answered. The patient agreed with the plan and demonstrated an understanding of the instructions.   The patient was advised to call back or seek an in-person evaluation if the symptoms worsen or if the condition fails to improve as anticipated.  I provided 15 minutes total of non-face-to-face time during this encounter including median intraservice time, reviewing previous notes, labs, imaging, medications and explaining diagnosis and management.     Charlott Rakes, MD, FAAFP. [facility], [place]   04/07/2019, 9:09 AM

## 2019-04-07 NOTE — Progress Notes (Signed)
Patient has been called and DOB has been verified. Patient has been screened and transferred to PCP to start phone visit.  C/C: knee and shoulder pain,   Refills: hctz,iron pills.

## 2019-04-08 ENCOUNTER — Telehealth: Payer: Self-pay

## 2019-04-08 LAB — CMP14+EGFR
ALT: 10 IU/L (ref 0–32)
AST: 14 IU/L (ref 0–40)
Albumin/Globulin Ratio: 1.4 (ref 1.2–2.2)
Albumin: 3.9 g/dL (ref 3.8–4.8)
Alkaline Phosphatase: 58 IU/L (ref 39–117)
BUN/Creatinine Ratio: 12 (ref 9–23)
BUN: 10 mg/dL (ref 6–24)
Bilirubin Total: 0.3 mg/dL (ref 0.0–1.2)
CO2: 25 mmol/L (ref 20–29)
Calcium: 9.3 mg/dL (ref 8.7–10.2)
Chloride: 101 mmol/L (ref 96–106)
Creatinine, Ser: 0.86 mg/dL (ref 0.57–1.00)
GFR calc Af Amer: 95 mL/min/{1.73_m2} (ref >59)
GFR calc non Af Amer: 82 mL/min/{1.73_m2} (ref >59)
Globulin, Total: 2.7 g/dL (ref 1.5–4.5)
Glucose: 99 mg/dL (ref 65–99)
Potassium: 3.9 mmol/L (ref 3.5–5.2)
Sodium: 140 mmol/L (ref 134–144)
Total Protein: 6.6 g/dL (ref 6.0–8.5)

## 2019-04-08 LAB — LIPID PANEL
Chol/HDL Ratio: 3.5 ratio (ref 0.0–4.4)
Cholesterol, Total: 178 mg/dL (ref 100–199)
HDL: 51 mg/dL (ref >39)
LDL Calculated: 111 mg/dL — ABNORMAL HIGH (ref 0–99)
Triglycerides: 82 mg/dL (ref 0–149)
VLDL Cholesterol Cal: 16 mg/dL (ref 5–40)

## 2019-04-08 NOTE — Telephone Encounter (Signed)
Patient name and DOB has been verified Patient was informed of lab results. Patient had no questions.  

## 2019-04-08 NOTE — Telephone Encounter (Signed)
-----   Message from Charlott Rakes, MD sent at 04/08/2019  9:58 AM EDT ----- Cholesterol, kidney, liver functions are normal

## 2019-04-18 ENCOUNTER — Other Ambulatory Visit: Payer: Self-pay | Admitting: Family Medicine

## 2019-04-18 DIAGNOSIS — N920 Excessive and frequent menstruation with regular cycle: Secondary | ICD-10-CM

## 2019-04-18 DIAGNOSIS — D500 Iron deficiency anemia secondary to blood loss (chronic): Secondary | ICD-10-CM

## 2019-04-18 IMAGING — US US TRANSVAGINAL NON-OB
1 series · 15 of 25 positions shown · non-contrast
Comparison: None

CLINICAL DATA: Menorrhagia.  Irregular menstrual cycle.



[Series 1: us transvaginal non-ob · 15 of 92 slices shown]
[im 1/92]
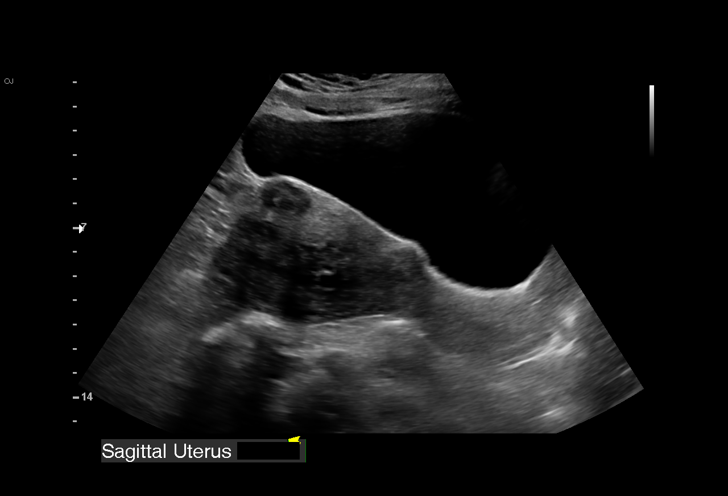
[im 8/92]
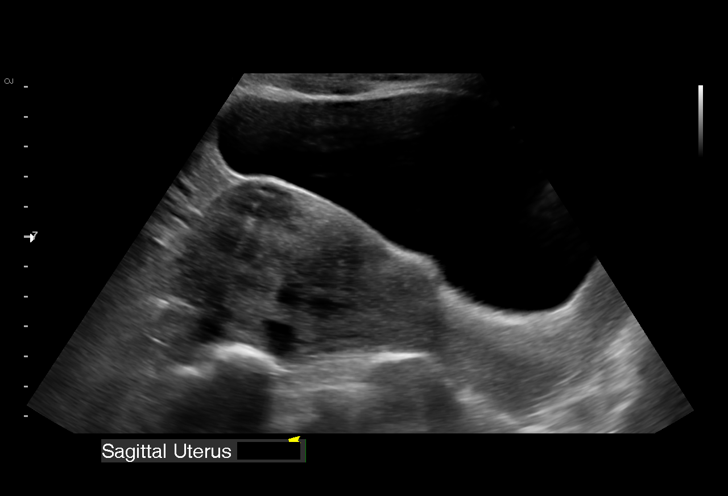
[im 16/92]
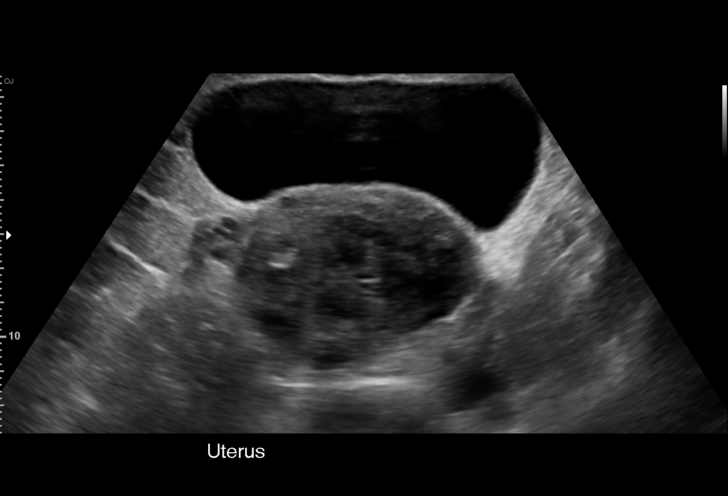
[im 19/92]
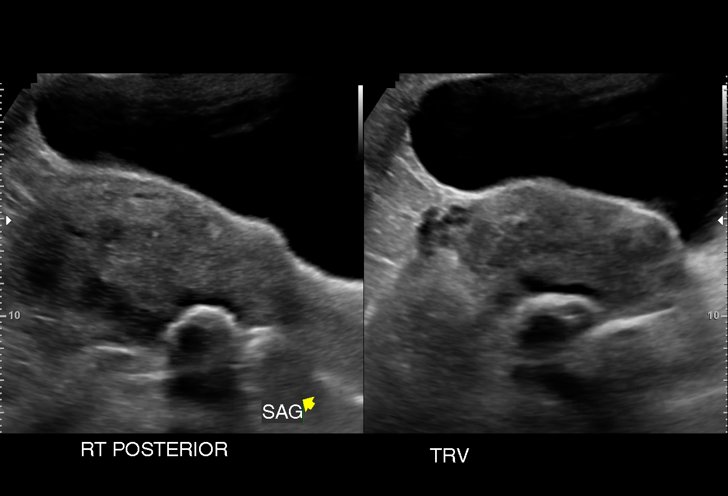
[im 27/92]
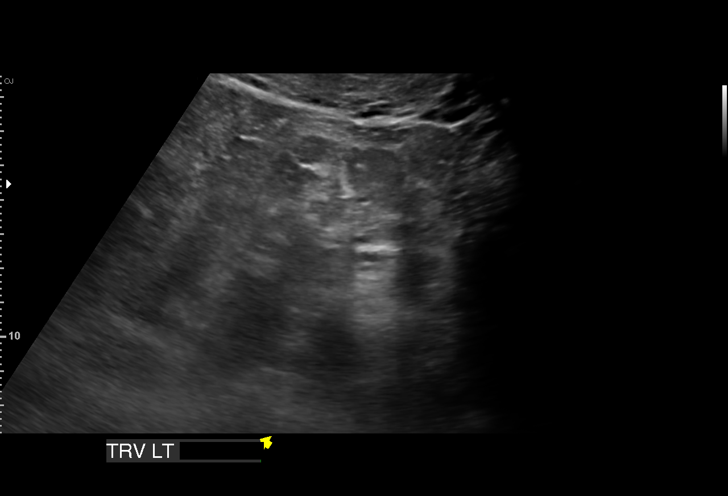
[im 35/92]
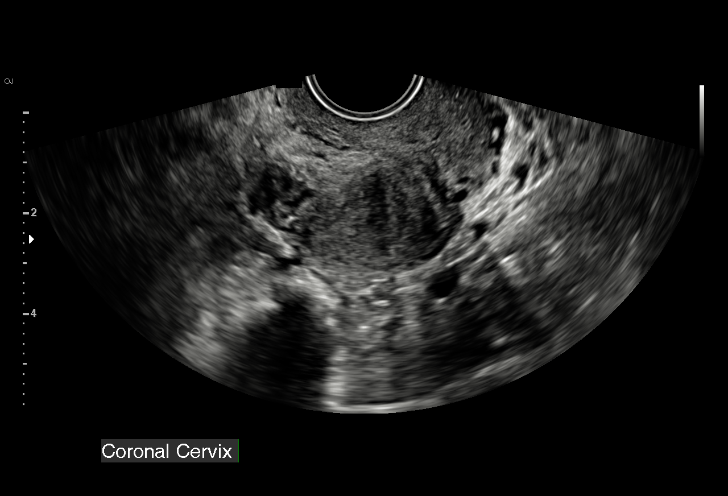
[im 38/92]
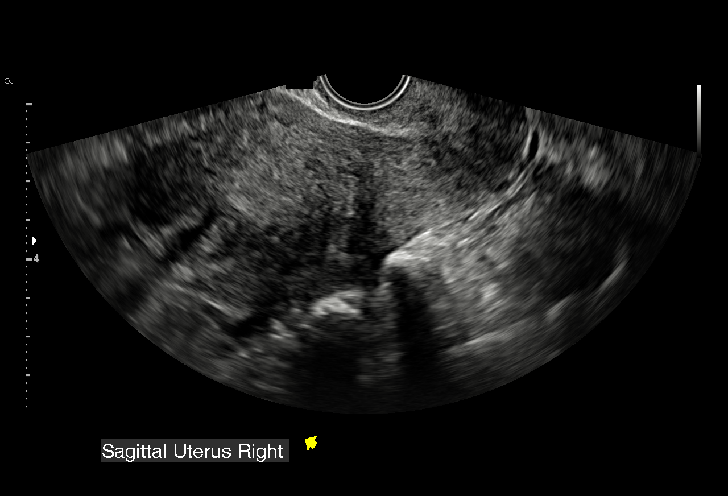
[im 46/92]
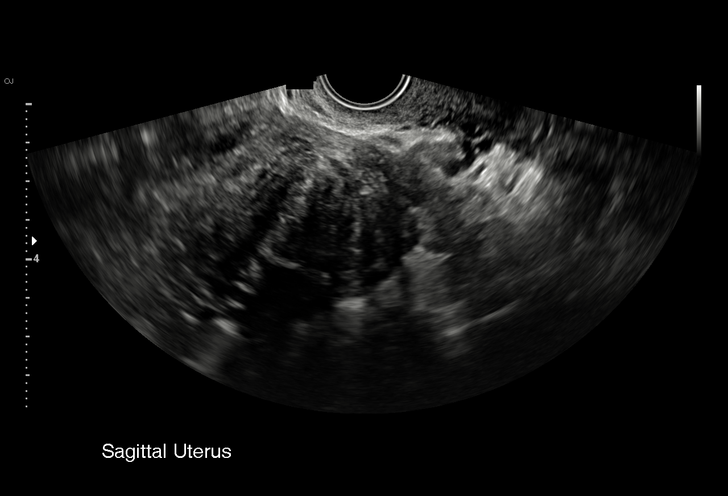
[im 54/92]
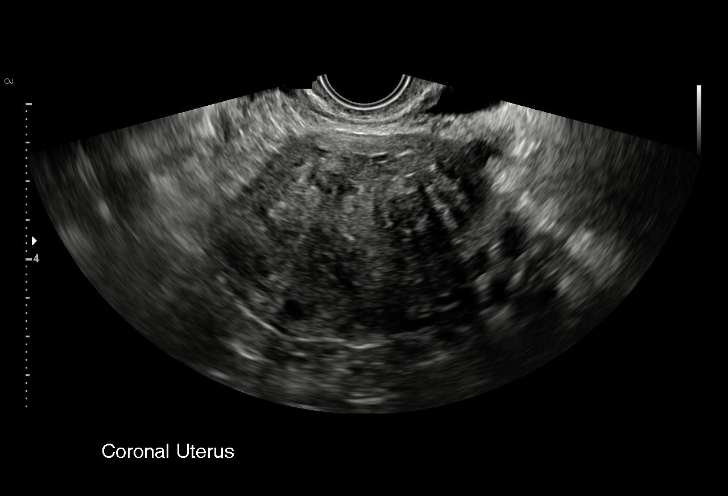
[im 57/92]
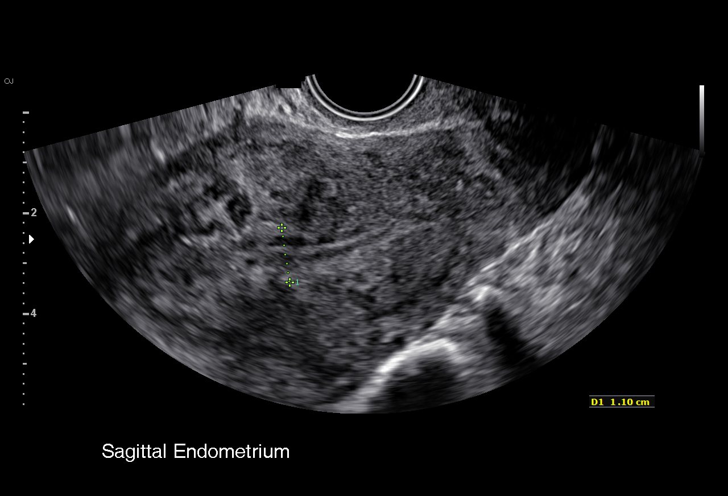
[im 65/92]
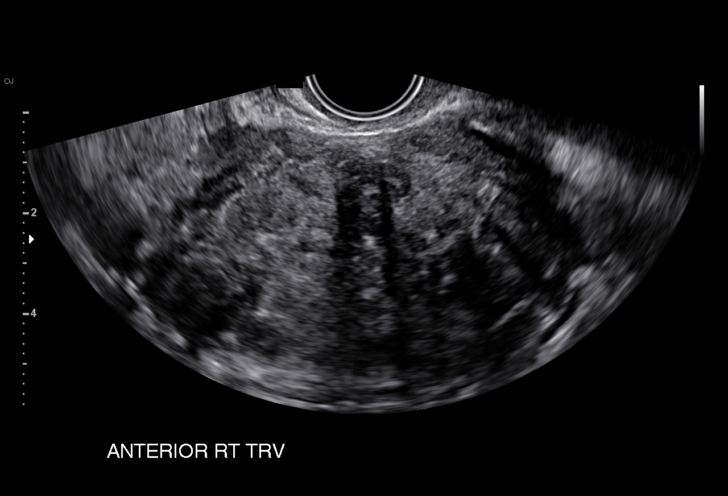
[im 73/92]
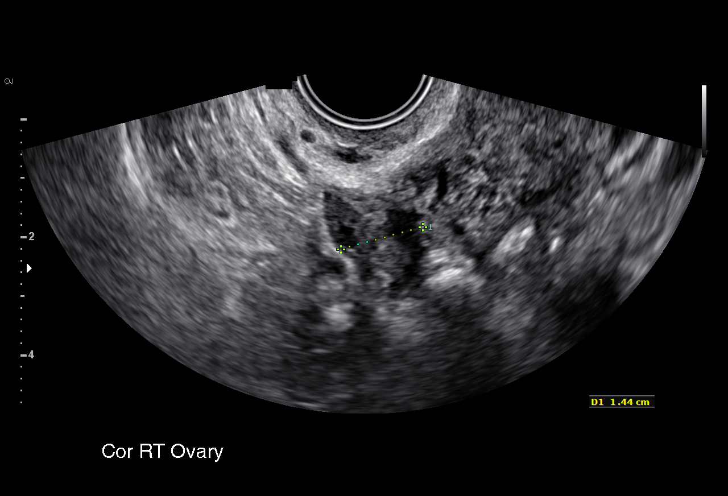
[im 76/92]
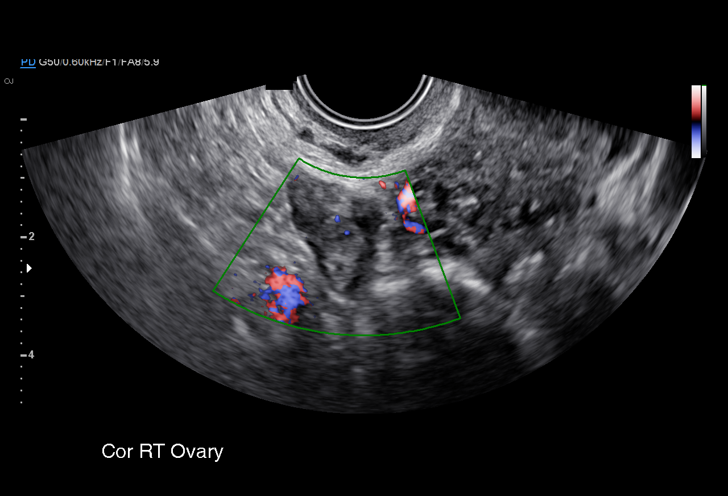
[im 84/92]
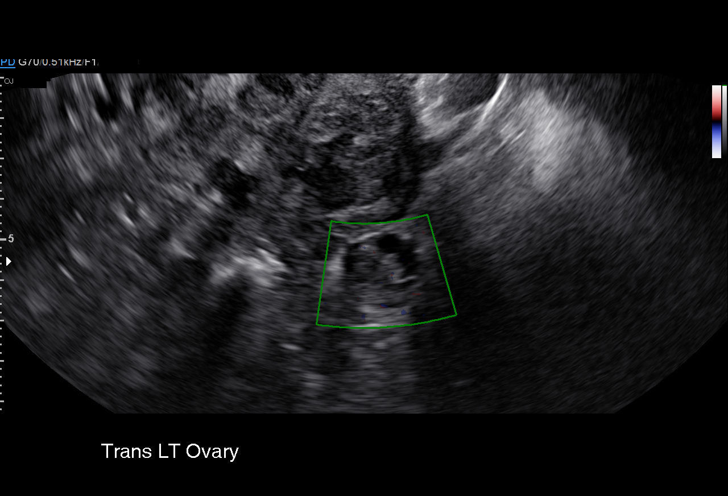
[im 92/92]
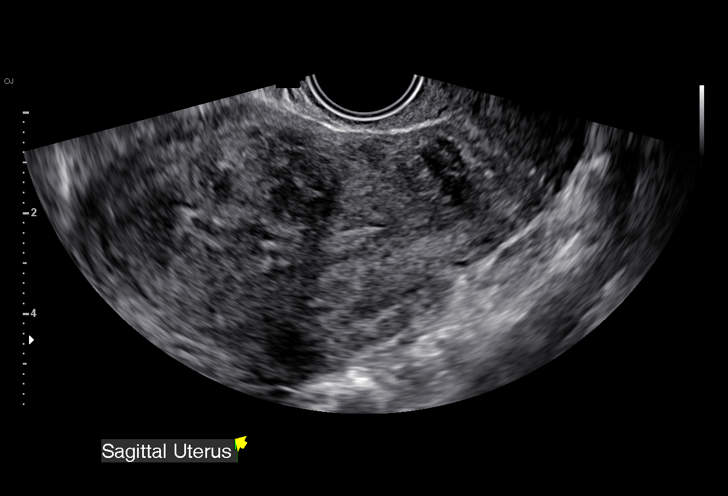

[15 of 25 positions shown; findings below may reference images not displayed]

FINDINGS: Uterus

Measurements: 12.4 x 5.8 x 7.5 cm. Multiple uterine fibroids are
demonstrated. There is a 3.4 x 3.6 x 3.3 cm fibroid within the left
aspect of the lower uterine segment. There is a 1.7 x 1.2 x 1.4 cm
fibroid within the anterior uterine body. There is a 2.4 x 1.8 x
cm fibroid within the posterior right uterine body.

Endometrium

Thickness: 11 mm.  No focal abnormality visualized.

Right ovary

Measurements: 2.0 x 1.1 x 1.4 cm. Normal appearance/no adnexal mass.

Left ovary

Measurements: 1.8 x 1.5 x 1.5 cm. Normal appearance/no adnexal mass.

Other findings

Trace pelvic fluid.
IMPRESSION: Fibroid uterus.

Endometrium measures 11 mm. If bleeding remains unresponsive to
hormonal or medical therapy, sonohysterogram should be considered
for focal lesion work-up. (Ref: Radiological Reasoning: Algorithmic
Workup of Abnormal Vaginal Bleeding with Endovaginal Sonography and
Sonohysterography. AJR [id number]; 191:[id number])

## 2019-08-31 ENCOUNTER — Encounter: Payer: Self-pay | Admitting: Family Medicine

## 2019-08-31 ENCOUNTER — Ambulatory Visit: Payer: Self-pay | Attending: Family Medicine | Admitting: Family Medicine

## 2019-08-31 ENCOUNTER — Other Ambulatory Visit: Payer: Self-pay

## 2019-08-31 VITALS — BP 119/76 | HR 74 | Temp 98.0°F | Ht 62.0 in | Wt 251.2 lb

## 2019-08-31 DIAGNOSIS — M25562 Pain in left knee: Secondary | ICD-10-CM

## 2019-08-31 DIAGNOSIS — N920 Excessive and frequent menstruation with regular cycle: Secondary | ICD-10-CM

## 2019-08-31 DIAGNOSIS — M25561 Pain in right knee: Secondary | ICD-10-CM

## 2019-08-31 DIAGNOSIS — I10 Essential (primary) hypertension: Secondary | ICD-10-CM

## 2019-08-31 DIAGNOSIS — M62838 Other muscle spasm: Secondary | ICD-10-CM

## 2019-08-31 DIAGNOSIS — D500 Iron deficiency anemia secondary to blood loss (chronic): Secondary | ICD-10-CM

## 2019-08-31 DIAGNOSIS — Z6841 Body Mass Index (BMI) 40.0 and over, adult: Secondary | ICD-10-CM

## 2019-08-31 DIAGNOSIS — G8929 Other chronic pain: Secondary | ICD-10-CM

## 2019-08-31 MED ORDER — IRON 325 (65 FE) MG PO TABS: 1.0000 | ORAL_TABLET | Freq: Two times a day (BID) | ORAL | 0 refills | Status: DC

## 2019-08-31 MED ORDER — IBUPROFEN 600 MG PO TABS: 600.0000 mg | ORAL_TABLET | Freq: Three times a day (TID) | ORAL | 2 refills | Status: DC | PRN

## 2019-08-31 MED ORDER — CYCLOBENZAPRINE HCL 10 MG PO TABS: 10.0000 mg | ORAL_TABLET | Freq: Every day | ORAL | 1 refills | Status: DC

## 2019-08-31 MED ORDER — HYDROCHLOROTHIAZIDE 25 MG PO TABS: 25.0000 mg | ORAL_TABLET | Freq: Every day | ORAL | 1 refills | Status: DC

## 2019-08-31 NOTE — Progress Notes (Signed)
Subjective:  Patient ID: Natalie Cervantes, female    DOB: [birth year]  Age: 43 y.o. MRN: [id number]  CC: Hypertension   HPI Natalie Cervantes is a 43 year old female with a history of hypertension, obesity, anemia secondary to menorrhagia who presents today for follow-up visit. She has been taking hydrochlorothiazide intermittently due to the fact that her blood pressures at home have been controlled with systolics in the low [id number].  She uses hydrochlorothiazide intermittently for pedal edema. She has chronic bilateral knee pain which are controlled on ibuprofen. Today her major complaint is muscle spasms which occur at night when she changes position and this results in hardening of her calf muscles with spontaneous resolution.  Symptoms are absent at this time Her menorrhagia is due to fibroids however she is unable to undergo definitive management due to lack of medical coverage.   Past Medical History:  Diagnosis Date  . No pertinent past medical history     Past Surgical History:  Procedure Laterality Date  . NO PAST SURGERIES      History reviewed. No pertinent family history.  No Known Allergies  Outpatient Medications Prior to Visit  Medication Sig Dispense Refill  . Ferrous Sulfate (IRON) 325 (65 Fe) MG TABS TAKE 1 TABLET BY MOUTH THREE TIMES DAILY WITH MEALS 90 tablet 0  . hydrochlorothiazide (HYDRODIURIL) 25 MG tablet Take 1 tablet (25 mg total) by mouth daily. 90 tablet 1  . ibuprofen (ADVIL) 600 MG tablet Take 1 tablet (600 mg total) by mouth every 8 (eight) hours as needed. Take with food. 60 tablet 2  . Elastic Bandages & Supports (KNEE BRACE) MISC APPLY ONE BRACE TO RIGHT KNEE FOR STABILITY AND SUPPORT. TO BE FITTED BY MEDICAL SUPPLY. (Patient not taking: Reported on 01/15/2018) 1 each 0   No facility-administered medications prior to visit.      ROS Review of Systems  Constitutional: Negative for activity change, appetite change and fatigue.  HENT: Negative for  congestion, sinus pressure and sore throat.   Eyes: Negative for visual disturbance.  Respiratory: Negative for cough, chest tightness, shortness of breath and wheezing.   Cardiovascular: Negative for chest pain and palpitations.  Gastrointestinal: Negative for abdominal distention, abdominal pain and constipation.  Endocrine: Negative for polydipsia.  Genitourinary: Negative for dysuria and frequency.  Musculoskeletal:       See HPI  Skin: Negative for rash.  Neurological: Negative for tremors, light-headedness and numbness.  Hematological: Does not bruise/bleed easily.  Psychiatric/Behavioral: Negative for agitation and behavioral problems.    Objective:  BP 119/76   Pulse 74   Temp 98 F (36.7 C) (Oral)   Ht 5\' 2"  (1.575 m)   Wt 251 lb 3.2 oz (113.9 kg)   SpO2 98%   BMI 45.95 kg/m   BP/Weight 08/31/2019 0000000 [id number]  Systolic BP 123456 123456 [id number]  Diastolic BP 76 69 67  Wt. (Lbs) 251.2 239.6 238.2  BMI 45.95 42.44 42.2      Physical Exam Constitutional:      Appearance: She is well-developed. She is obese.  Neck:     Vascular: No JVD.  Cardiovascular:     Rate and Rhythm: Normal rate.     Heart sounds: Normal heart sounds. No murmur.  Pulmonary:     Effort: Pulmonary effort is normal.     Breath sounds: Normal breath sounds. No wheezing or rales.  Chest:     Chest wall: No tenderness.  Abdominal:     General: Bowel sounds are normal. There  is no distension.     Palpations: Abdomen is soft. There is no mass.     Tenderness: There is no abdominal tenderness.  Musculoskeletal: Normal range of motion.     Right lower leg: No edema.     Left lower leg: No edema.     Comments: Crepitus in both knees bilateral  Neurological:     Mental Status: She is alert and oriented to person, place, and time.  Psychiatric:        Mood and Affect: Mood normal.     CMP Latest Ref Rng & Units 04/07/2019 04/15/2018 12/31/2017  Glucose 65 - 99 mg/dL 99 96 105(H)  BUN 6 - 24 mg/dL  10 9 9   Creatinine 0.57 - 1.00 mg/dL 0.86 0.77 0.87  Sodium 134 - 144 mmol/L 140 140 138  Potassium 3.5 - 5.2 mmol/L 3.9 3.9 4.4  Chloride 96 - 106 mmol/L 101 102 100  CO2 20 - 29 mmol/L 25 26 23   Calcium 8.7 - 10.2 mg/dL 9.3 9.1 9.3  Total Protein 6.0 - 8.5 g/dL 6.6 - 7.0  Total Bilirubin 0.0 - 1.2 mg/dL 0.3 - 0.3  Alkaline Phos 39 - 117 IU/L 58 - 55  AST 0 - 40 IU/L 14 - 15  ALT 0 - 32 IU/L 10 - 11    Lipid Panel     Component Value Date/Time   CHOL 178 04/07/2019 1005   TRIG 82 04/07/2019 1005   HDL 51 04/07/2019 1005   CHOLHDL 3.5 04/07/2019 1005   LDLCALC 111 (H) 04/07/2019 1005    CBC    Component Value Date/Time   WBC 5.0 04/15/2018 0918   WBC 11.7 (H) 07/19/2012 0512   RBC 4.15 04/15/2018 0918   RBC 4.08 07/19/2012 0512   HGB 11.1 04/15/2018 0918   HCT 34.1 04/15/2018 0918   PLT 291 04/15/2018 0918   MCV 82 04/15/2018 0918   MCH 26.7 04/15/2018 0918   MCH 28.2 07/19/2012 0512   MCHC 32.6 04/15/2018 0918   MCHC 33.1 07/19/2012 0512   RDW 16.0 (H) 04/15/2018 0918   LYMPHSABS 1.9 04/15/2018 0918   EOSABS 0.1 04/15/2018 0918   BASOSABS 0.0 04/15/2018 0918    Lab Results  Component Value Date   HGBA1C 5.2 12/31/2017    Assessment & Plan:   1. Essential hypertension Controlled This has mainly been dietary controlled as she only uses hydrochlorothiazide for pedal edema Counseled on blood pressure goal of less than 130/80, low-sodium, DASH diet, medication compliance, 150 minutes of moderate intensity exercise per week. Discussed medication compliance, adverse effects. - hydrochlorothiazide (HYDRODIURIL) 25 MG tablet; Take 1 tablet (25 mg total) by mouth daily.  Dispense: 90 tablet; Refill: 1 - Basic Metabolic Panel  2. Iron deficiency anemia due to chronic blood loss Controlled Secondary to menorrhagia - Ferrous Sulfate (IRON) 325 (65 Fe) MG TABS; Take 1 tablet (325 mg total) by mouth 2 (two) times daily.  Dispense: 180 tablet; Refill: 0 - CBC with  Differential/Platelet  3. Menorrhagia with regular cycle Secondary to fibroids-unable to undergo definitive management due to lack of medical coverage - Ferrous Sulfate (IRON) 325 (65 Fe) MG TABS; Take 1 tablet (325 mg total) by mouth 2 (two) times daily.  Dispense: 180 tablet; Refill: 0  4. Chronic pain of both knees Stable Weight loss is beneficial - ibuprofen (ADVIL) 600 MG tablet; Take 1 tablet (600 mg total) by mouth every 8 (eight) hours as needed. Take with food.  Dispense: 60 tablet; Refill:  2  5. Class 3 severe obesity due to excess calories without serious comorbidity with body mass index (BMI) of 40.0 to 44.9 in adult [facility]) Counseled on 150 minutes of exercise per week, healthy eating (including decreased daily intake of saturated fats, cholesterol, added sugars, sodium)  6. Muscle spasm Advised to keep feet warm at night We will check potassium level Commence cyclobenzaprine - cyclobenzaprine (FLEXERIL) 10 MG tablet; Take 1 tablet (10 mg total) by mouth at bedtime.  Dispense: 30 tablet; Refill: 1    Meds ordered this encounter  Medications  . cyclobenzaprine (FLEXERIL) 10 MG tablet    Sig: Take 1 tablet (10 mg total) by mouth at bedtime.    Dispense:  30 tablet    Refill:  1  . hydrochlorothiazide (HYDRODIURIL) 25 MG tablet    Sig: Take 1 tablet (25 mg total) by mouth daily.    Dispense:  90 tablet    Refill:  1  . Ferrous Sulfate (IRON) 325 (65 Fe) MG TABS    Sig: Take 1 tablet (325 mg total) by mouth 2 (two) times daily.    Dispense:  180 tablet    Refill:  0  . ibuprofen (ADVIL) 600 MG tablet    Sig: Take 1 tablet (600 mg total) by mouth every 8 (eight) hours as needed. Take with food.    Dispense:  60 tablet    Refill:  2    D/c previous script.    Follow-up: Return in about 6 months (around 02/28/2020) for medical conditions.       Charlott Rakes, MD, FAAFP. [facility], [place]   08/31/2019,  4:46 PM

## 2019-08-31 NOTE — Patient Instructions (Signed)
° °Calorie Counting for Weight Loss °Calories are units of energy. Your body needs a certain amount of calories from food to keep you going throughout the day. When you eat more calories than your body needs, your body stores the extra calories as fat. When you eat fewer calories than your body needs, your body burns fat to get the energy it needs. °Calorie counting means keeping track of how many calories you eat and drink each day. Calorie counting can be helpful if you need to lose weight. If you make sure to eat fewer calories than your body needs, you should lose weight. Ask your health care provider what a healthy weight is for you. °For calorie counting to work, you will need to eat the right number of calories in a day in order to lose a healthy amount of weight per week. A dietitian can help you determine how many calories you need in a day and will give you suggestions on how to reach your calorie goal. °· A healthy amount of weight to lose per week is usually 1-2 lb (0.5-0.9 kg). This usually means that your daily calorie intake should be reduced by 500-750 calories. °· Eating 1,200 - 1,500 calories per day can help most women lose weight. °· Eating 1,500 - 1,800 calories per day can help most men lose weight. °What is my plan? °My goal is to have __________ calories per day. °If I have this many calories per day, I should lose around __________ pounds per week. °What do I need to know about calorie counting? °In order to meet your daily calorie goal, you will need to: °· Find out how many calories are in each food you would like to eat. Try to do this before you eat. °· Decide how much of the food you plan to eat. °· Write down what you ate and how many calories it had. Doing this is called keeping a food log. °To successfully lose weight, it is important to balance calorie counting with a healthy lifestyle that includes regular activity. Aim for 150 minutes of moderate exercise (such as walking) or 75  minutes of vigorous exercise (such as running) each week. °Where do I find calorie information? ° °The number of calories in a food can be found on a Nutrition Facts label. If a food does not have a Nutrition Facts label, try to look up the calories online or ask your dietitian for help. °Remember that calories are listed per serving. If you choose to have more than one serving of a food, you will have to multiply the calories per serving by the amount of servings you plan to eat. For example, the label on a package of bread might say that a serving size is 1 slice and that there are 90 calories in a serving. If you eat 1 slice, you will have eaten 90 calories. If you eat 2 slices, you will have eaten 180 calories. °How do I keep a food log? °Immediately after each meal, record the following information in your food log: °· What you ate. Don't forget to include toppings, sauces, and other extras on the food. °· How much you ate. This can be measured in cups, ounces, or number of items. °· How many calories each food and drink had. °· The total number of calories in the meal. °Keep your food log near you, such as in a small notebook in your pocket, or use a mobile app or website. Some programs will   calculate calories for you and show you how many calories you have left for the day to meet your goal. °What are some calorie counting tips? ° °· Use your calories on foods and drinks that will fill you up and not leave you hungry: °? Some examples of foods that fill you up are nuts and nut butters, vegetables, lean proteins, and high-fiber foods like whole grains. High-fiber foods are foods with more than 5 g fiber per serving. °? Drinks such as sodas, specialty coffee drinks, alcohol, and juices have a lot of calories, yet do not fill you up. °· Eat nutritious foods and avoid empty calories. Empty calories are calories you get from foods or beverages that do not have many vitamins or protein, such as candy, sweets, and  soda. It is better to have a nutritious high-calorie food (such as an avocado) than a food with few nutrients (such as a bag of chips). °· Know how many calories are in the foods you eat most often. This will help you calculate calorie counts faster. °· Pay attention to calories in drinks. Low-calorie drinks include water and unsweetened drinks. °· Pay attention to nutrition labels for "low fat" or "fat free" foods. These foods sometimes have the same amount of calories or more calories than the full fat versions. They also often have added sugar, starch, or salt, to make up for flavor that was removed with the fat. °· Find a way of tracking calories that works for you. Get creative. Try different apps or programs if writing down calories does not work for you. °What are some portion control tips? °· Know how many calories are in a serving. This will help you know how many servings of a certain food you can have. °· Use a measuring cup to measure serving sizes. You could also try weighing out portions on a kitchen scale. With time, you will be able to estimate serving sizes for some foods. °· Take some time to put servings of different foods on your favorite plates, bowls, and cups so you know what a serving looks like. °· Try not to eat straight from a bag or box. Doing this can lead to overeating. Put the amount you would like to eat in a cup or on a plate to make sure you are eating the right portion. °· Use smaller plates, glasses, and bowls to prevent overeating. °· Try not to multitask (for example, watch TV or use your computer) while eating. If it is time to eat, sit down at a table and enjoy your food. This will help you to know when you are full. It will also help you to be aware of what you are eating and how much you are eating. °What are tips for following this plan? °Reading food labels °· Check the calorie count compared to the serving size. The serving size may be smaller than what you are used to  eating. °· Check the source of the calories. Make sure the food you are eating is high in vitamins and protein and low in saturated and trans fats. °Shopping °· Read nutrition labels while you shop. This will help you make healthy decisions before you decide to purchase your food. °· Make a grocery list and stick to it. °Cooking °· Try to cook your favorite foods in a healthier way. For example, try baking instead of frying. °· Use low-fat dairy products. °Meal planning °· Use more fruits and vegetables. Half of your plate should be   fruits and vegetables. °· Include lean proteins like poultry and fish. °How do I count calories when eating out? °· Ask for smaller portion sizes. °· Consider sharing an entree and sides instead of getting your own entree. °· If you get your own entree, eat only half. Ask for a box at the beginning of your meal and put the rest of your entree in it so you are not tempted to eat it. °· If calories are listed on the menu, choose the lower calorie options. °· Choose dishes that include vegetables, fruits, whole grains, low-fat dairy products, and lean protein. °· Choose items that are boiled, broiled, grilled, or steamed. Stay away from items that are buttered, battered, fried, or served with cream sauce. Items labeled "crispy" are usually fried, unless stated otherwise. °· Choose water, low-fat milk, unsweetened iced tea, or other drinks without added sugar. If you want an alcoholic beverage, choose a lower calorie option such as a glass of wine or light beer. °· Ask for dressings, sauces, and syrups on the side. These are usually high in calories, so you should limit the amount you eat. °· If you want a salad, choose a garden salad and ask for grilled meats. Avoid extra toppings like bacon, cheese, or fried items. Ask for the dressing on the side, or ask for olive oil and vinegar or lemon to use as dressing. °· Estimate how many servings of a food you are given. For example, a serving of  cooked rice is ½ cup or about the size of half a baseball. Knowing serving sizes will help you be aware of how much food you are eating at restaurants. The list below tells you how big or small some common portion sizes are based on everyday objects: °? 1 oz--4 stacked dice. °? 3 oz--1 deck of cards. °? 1 tsp--1 die. °? 1 Tbsp--½ a ping-pong ball. °? 2 Tbsp--1 ping-pong ball. °? ½ cup--½ baseball. °? 1 cup--1 baseball. °Summary °· Calorie counting means keeping track of how many calories you eat and drink each day. If you eat fewer calories than your body needs, you should lose weight. °· A healthy amount of weight to lose per week is usually 1-2 lb (0.5-0.9 kg). This usually means reducing your daily calorie intake by 500-750 calories. °· The number of calories in a food can be found on a Nutrition Facts label. If a food does not have a Nutrition Facts label, try to look up the calories online or ask your dietitian for help. °· Use your calories on foods and drinks that will fill you up, and not on foods and drinks that will leave you hungry. °· Use smaller plates, glasses, and bowls to prevent overeating. °This information is not intended to replace advice given to you by your health care provider. Make sure you discuss any questions you have with your health care provider. °Document Released: 11/24/2005 Document Revised: 08/13/2018 Document Reviewed: 10/24/2016 °Elsevier Patient Education © 2020 [employer] ° °

## 2019-09-01 LAB — BASIC METABOLIC PANEL
BUN/Creatinine Ratio: 12 (ref 9–23)
BUN: 11 mg/dL (ref 6–24)
CO2: 25 mmol/L (ref 20–29)
Calcium: 8.8 mg/dL (ref 8.7–10.2)
Chloride: 104 mmol/L (ref 96–106)
Creatinine, Ser: 0.91 mg/dL (ref 0.57–1.00)
GFR calc Af Amer: 88 mL/min/{1.73_m2} (ref >59)
GFR calc non Af Amer: 76 mL/min/{1.73_m2} (ref >59)
Glucose: 96 mg/dL (ref 65–99)
Potassium: 3.8 mmol/L (ref 3.5–5.2)
Sodium: 140 mmol/L (ref 134–144)

## 2019-09-01 LAB — CBC WITH DIFFERENTIAL/PLATELET
Basophils Absolute: 0.1 10*3/uL (ref 0.0–0.2)
Basos: 1 %
EOS (ABSOLUTE): 0.1 10*3/uL (ref 0.0–0.4)
Eos: 1 %
Hematocrit: 35.7 % (ref 34.0–46.6)
Hemoglobin: 11.5 g/dL (ref 11.1–15.9)
Immature Grans (Abs): 0 10*3/uL (ref 0.0–0.1)
Immature Granulocytes: 0 %
Lymphocytes Absolute: 2.4 10*3/uL (ref 0.7–3.1)
Lymphs: 40 %
MCH: 26.5 pg — ABNORMAL LOW (ref 26.6–33.0)
MCHC: 32.2 g/dL (ref 31.5–35.7)
MCV: 82 fL (ref 79–97)
Monocytes Absolute: 0.6 10*3/uL (ref 0.1–0.9)
Monocytes: 10 %
Neutrophils Absolute: 3 10*3/uL (ref 1.4–7.0)
Neutrophils: 48 %
Platelets: 284 10*3/uL (ref 150–450)
RBC: 4.34 x10E6/uL (ref 3.77–5.28)
RDW: 14.3 % (ref 11.7–15.4)
WBC: 6.1 10*3/uL (ref 3.4–10.8)

## 2019-09-09 ENCOUNTER — Telehealth: Payer: Self-pay

## 2019-09-09 NOTE — Telephone Encounter (Signed)
-----   Message from Charlott Rakes, MD sent at 09/01/2019 10:50 AM EDT ----- Please inform the patient that labs are normal. Thank you.

## 2019-09-09 NOTE — Telephone Encounter (Signed)
Patient name and DOB has been verified Patient was informed of lab results. Patient had no questions.  

## 2020-01-12 ENCOUNTER — Ambulatory Visit: Payer: Self-pay | Admitting: Pharmacist

## 2020-01-24 ENCOUNTER — Telehealth: Payer: Self-pay | Admitting: Family Medicine

## 2020-01-24 NOTE — Telephone Encounter (Signed)
Called Patient to inform her that 2/4 of the vaccines she is intending on receiving tomorrow could not be completed in the office. The patient requested for MMR, flu, Varicella, and Tetanus. Patient can receive Flu and Tetanus. Patient cannot receive Varicella and MMR. Patient was instructed to call office back to cancel if she just wanted the 2 vaccines. Patient could be scheduled for vaccines with pharmacist in clinic but doesn't need an office visit to do so.

## 2020-01-25 ENCOUNTER — Ambulatory Visit: Payer: Self-pay | Attending: Family Medicine | Admitting: Pharmacist

## 2020-01-25 ENCOUNTER — Ambulatory Visit: Payer: Self-pay | Admitting: Family Medicine

## 2020-01-25 ENCOUNTER — Other Ambulatory Visit: Payer: Self-pay

## 2020-01-25 DIAGNOSIS — Z23 Encounter for immunization: Secondary | ICD-10-CM

## 2020-01-25 NOTE — Progress Notes (Signed)
Patient presents for vaccination against influenza and tetanus per orders of Dr. Margarita Rana. Consent given. Counseling provided. No contraindications exists. Vaccine administered without incident.

## 2020-02-07 ENCOUNTER — Telehealth: Payer: Self-pay | Admitting: Family Medicine

## 2020-02-07 NOTE — Telephone Encounter (Signed)
Pt called asking for shot record

## 2020-02-07 NOTE — Telephone Encounter (Signed)
Patient was called and informed that shot record will be mailed per patient request.

## 2020-02-20 ENCOUNTER — Telehealth: Payer: Self-pay | Admitting: Family Medicine

## 2020-02-20 NOTE — Telephone Encounter (Signed)
Patient was given shot record.

## 2020-02-20 NOTE — Telephone Encounter (Signed)
Pt is coming at 2:30 never received shot record in mail.

## 2020-04-03 ENCOUNTER — Telehealth: Payer: Self-pay | Admitting: Family Medicine

## 2020-04-03 NOTE — Telephone Encounter (Signed)
Patient called and wanted to inform pcp that she received the covid shot last week and she had her period before vaccine. Patient stated that she is currently on her period again and was concerned if it this is happening due to her having the vaccine. Please follow up at your earliest convenience.

## 2020-04-05 NOTE — Telephone Encounter (Signed)
Patient was called and she states that she has had 2 periods in 1 month and she is very concerned.

## 2020-04-05 NOTE — Telephone Encounter (Signed)
Patient called in and requested for a call from her pcp or MA. Please follow up at your earliest convenience.

## 2020-04-06 NOTE — Telephone Encounter (Signed)
Please schedule a visit with any available Clinician

## 2020-04-06 NOTE — Telephone Encounter (Signed)
Patient has been set up with an OV with walk-in provider.

## 2020-04-11 ENCOUNTER — Ambulatory Visit: Payer: Self-pay | Attending: Family Medicine | Admitting: Physician Assistant

## 2020-04-11 ENCOUNTER — Other Ambulatory Visit: Payer: Self-pay

## 2020-04-11 DIAGNOSIS — N926 Irregular menstruation, unspecified: Secondary | ICD-10-CM

## 2020-04-11 LAB — POCT URINE PREGNANCY: Preg Test, Ur: NEGATIVE

## 2020-04-11 NOTE — Progress Notes (Signed)
Virtual Visit via Telephone Note  I connected with Natalie Cervantes on 04/11/20 at  3:50 PM EDT by telephone and verified that I am speaking with the correct person using two identifiers.   I discussed the limitations, risks, security and privacy concerns of performing an evaluation and management service by telephone and the availability of in person appointments. I also discussed with the patient that there may be a patient responsible charge related to this service. The patient expressed understanding and agreed to proceed.  PATIENT visit by telephone virtually in the context of Covid-19 pandemic. Patient location:  home My Location:  [place] office Persons on the call:  Me and the patient  History of Present Illness:    Patient usus has periods about every 28-33 days.  In April, she had her period twice.  She is sexually active and does not use birth control of any kind.  In April, she had a regular period the 12-17th then got the Covid vaccine and started her period again a few days later and had another period.  She denies hot flashes or other symptoms of menopause.  She has not taken a pregnacy test.  No vaginal discharge, pelvic pain,fever.      Observations/Objective:  NAD.  A&Ox3   Assessment and Plan: 1. Irregular menses - POCT urine pregnancy - Thyroid Panel With TSH; Future - FSH/LH; Future - CBC with Differential/Platelet; Future Use condoms   Follow Up Instructions: See PCP in 4-6 months or prn   I discussed the assessment and treatment plan with the patient. The patient was provided an opportunity to ask questions and all were answered. The patient agreed with the plan and demonstrated an understanding of the instructions.   The patient was advised to call back or seek an in-person evaluation if the symptoms worsen or if the condition fails to improve as anticipated.  I provided 12 minutes of non-face-to-face time during this encounter.   Freeman Caldron, PA-C  Patient  ID: Natalie Cervantes, female   DOB: 1976/09/25, 44 y.o.   MRN: [id number]

## 2020-04-12 LAB — THYROID PANEL WITH TSH
Free Thyroxine Index: 1.6 (ref 1.2–4.9)
T3 Uptake Ratio: 27 % (ref 24–39)
T4, Total: 5.9 ug/dL (ref 4.5–12.0)
TSH: 2.52 u[IU]/mL (ref 0.450–4.500)

## 2020-04-12 LAB — CBC WITH DIFFERENTIAL/PLATELET
Basophils Absolute: 0.1 10*3/uL (ref 0.0–0.2)
Basos: 1 %
EOS (ABSOLUTE): 0.1 10*3/uL (ref 0.0–0.4)
Eos: 1 %
Hematocrit: 36.6 % (ref 34.0–46.6)
Hemoglobin: 11.7 g/dL (ref 11.1–15.9)
Immature Grans (Abs): 0 10*3/uL (ref 0.0–0.1)
Immature Granulocytes: 0 %
Lymphocytes Absolute: 2.4 10*3/uL (ref 0.7–3.1)
Lymphs: 34 %
MCH: 25.6 pg — ABNORMAL LOW (ref 26.6–33.0)
MCHC: 32 g/dL (ref 31.5–35.7)
MCV: 80 fL (ref 79–97)
Monocytes Absolute: 0.5 10*3/uL (ref 0.1–0.9)
Monocytes: 7 %
Neutrophils Absolute: 4 10*3/uL (ref 1.4–7.0)
Neutrophils: 57 %
Platelets: 251 10*3/uL (ref 150–450)
RBC: 4.57 x10E6/uL (ref 3.77–5.28)
RDW: 16.4 % — ABNORMAL HIGH (ref 11.7–15.4)
WBC: 7.1 10*3/uL (ref 3.4–10.8)

## 2020-04-12 LAB — FSH/LH
FSH: 10.4 m[IU]/mL
LH: 5.6 m[IU]/mL

## 2020-05-12 ENCOUNTER — Other Ambulatory Visit: Payer: Self-pay | Admitting: Family Medicine

## 2020-05-12 DIAGNOSIS — N920 Excessive and frequent menstruation with regular cycle: Secondary | ICD-10-CM

## 2020-05-12 DIAGNOSIS — D500 Iron deficiency anemia secondary to blood loss (chronic): Secondary | ICD-10-CM

## 2020-05-16 ENCOUNTER — Other Ambulatory Visit: Payer: Self-pay | Admitting: Family Medicine

## 2020-05-16 DIAGNOSIS — D500 Iron deficiency anemia secondary to blood loss (chronic): Secondary | ICD-10-CM

## 2020-05-16 DIAGNOSIS — N920 Excessive and frequent menstruation with regular cycle: Secondary | ICD-10-CM

## 2020-07-04 ENCOUNTER — Telehealth: Payer: Self-pay | Admitting: Family Medicine

## 2020-07-04 DIAGNOSIS — M25561 Pain in right knee: Secondary | ICD-10-CM

## 2020-07-04 MED ORDER — IBUPROFEN 800 MG PO TABS: 800.0000 mg | ORAL_TABLET | Freq: Two times a day (BID) | ORAL | 1 refills | Status: DC | PRN

## 2020-07-04 NOTE — Telephone Encounter (Signed)
Increased to 800 mg

## 2020-07-04 NOTE — Telephone Encounter (Signed)
Please advise.   Copied from [place] [id number]. Topic: General - Other >> Jul 02, 2020  3:19 PM Yvette Rack wrote: Reason for CRM: Pt friend Mikeal Hawthorne called and requested that the Rx for ibuprofen (ADVIL) 600 MG tablet be increased. Mikeal Hawthorne stated pt still has lots of pain and she requests that the pain medication be increased.

## 2020-07-04 NOTE — Telephone Encounter (Signed)
Patient is requesting increase in ibuprofen, pain is worse.

## 2020-07-04 NOTE — Telephone Encounter (Signed)
Patient was called and a voicemail was left informing patient that medication has been increased and sent to pharmacy.

## 2020-08-15 ENCOUNTER — Ambulatory Visit: Payer: Self-pay | Admitting: Family Medicine

## 2020-12-28 ENCOUNTER — Encounter: Payer: Self-pay | Admitting: Obstetrics & Gynecology

## 2020-12-28 ENCOUNTER — Ambulatory Visit (INDEPENDENT_AMBULATORY_CARE_PROVIDER_SITE_OTHER): Payer: Self-pay | Admitting: Obstetrics & Gynecology

## 2020-12-28 ENCOUNTER — Other Ambulatory Visit: Payer: Self-pay

## 2020-12-28 VITALS — BP 148/80 | HR 64 | Ht 63.25 in | Wt 252.0 lb

## 2020-12-28 DIAGNOSIS — D500 Iron deficiency anemia secondary to blood loss (chronic): Secondary | ICD-10-CM

## 2020-12-28 DIAGNOSIS — N920 Excessive and frequent menstruation with regular cycle: Secondary | ICD-10-CM

## 2020-12-28 DIAGNOSIS — D259 Leiomyoma of uterus, unspecified: Secondary | ICD-10-CM

## 2020-12-28 NOTE — Progress Notes (Signed)
    Natalie Cervantes 07-18-1976 [id number]        45 y.o.  [id number]   RP: Menometrorrhagia with Fibroids  HPI: Menometrorrhagia worsening in the last few months.  Known uterine Fibroids.  Hb was at 11.7 in 04/2020.  Per patient, BCPs didn't help in the past.  Last Pelvic US 12/2017 showed a uterine size about 12 cm with 3 Fibroids measured from 1-3 cm in diameter.  Mild pelvic discomfort and Dysmenorrhea.   OB History  Gravida Para Term Preterm AB Living  7 3 3  0 4 3  SAB IAB Ectopic Multiple Live Births  4 0 0 0 1    # Outcome Date GA Lbr Len/2nd Weight Sex Delivery Anes PTL Lv  7 Term 07/18/12 [redacted]w[redacted]d 06:02 / 00:26 4 lb 12.9 oz (2.18 kg) F Vag-Spont EPI  LIV  6 SAB           5 SAB           4 SAB           3 SAB           2 Term           1 Term             Past medical history,surgical history, problem list, medications, allergies, family history and social history were all reviewed and documented in the EPIC chart.   Directed ROS with pertinent positives and negatives documented in the history of present illness/assessment and plan.  Exam:  Vitals:   12/28/20 1213  BP: (!) 148/80  Pulse: 64  Weight: 252 lb (114.3 kg)  Height: 5' 3.25" (1.607 m)   General appearance:  Normal  Abdomen: Normal  Gynecologic exam: Vulva normal.  Speculum:  Cervix/Vagina normal.  Bimanual exam:  Uterus AV about 12 cm, nodular, mobile, NT.  No adnexal mass felt, NT.   Assessment/Plan:  45 y.o. [id number]   1. Menorrhagia with regular cycle Worsening menometrorrhagia with history of uterine fibroids and secondary anemia.  We will recheck a CBC and TSH today.  Patient will follow up for a pelvic ultrasound to reassess the fibroids in terms of number, size and location.  Previous hormonal treatment with birth control pills did not control the symptoms per patient.  Would like to proceed with total hysterectomy. XI robotic total laparoscopic hysterectomy with bilateral salpingectomy reviewed with  patient. - CBC - TSH - US Transvaginal Non-OB; Future  2. Uterine leiomyoma, unspecified location Last pelvic ultrasound was January 2019, will repeat a pelvic ultrasound at follow-up to reevaluate the number, size and location of the fibroids. - US Transvaginal Non-OB; Future  3. Iron deficiency anemia due to chronic blood loss CBC today to evaluate the level of anemia.  Iron supplements recommended. - CBC  Princess Bruins MD, 12:42 PM 12/28/2020

## 2020-12-29 LAB — CBC
HCT: 32.1 % — ABNORMAL LOW (ref 35.0–45.0)
Hemoglobin: 9.7 g/dL — ABNORMAL LOW (ref 11.7–15.5)
MCH: 23.8 pg — ABNORMAL LOW (ref 27.0–33.0)
MCHC: 30.2 g/dL — ABNORMAL LOW (ref 32.0–36.0)
MCV: 78.9 fL — ABNORMAL LOW (ref 80.0–100.0)
MPV: 11.2 fL (ref 7.5–12.5)
Platelets: 298 10*3/uL (ref 140–400)
RBC: 4.07 10*6/uL (ref 3.80–5.10)
RDW: 17.2 % — ABNORMAL HIGH (ref 11.0–15.0)
WBC: 6.1 10*3/uL (ref 3.8–10.8)

## 2020-12-29 LAB — TSH: TSH: 2.72 mIU/L

## 2021-01-03 ENCOUNTER — Encounter: Payer: Self-pay | Admitting: Obstetrics & Gynecology

## 2021-01-10 ENCOUNTER — Other Ambulatory Visit: Payer: Self-pay

## 2021-01-10 ENCOUNTER — Ambulatory Visit: Payer: Self-pay | Admitting: Obstetrics & Gynecology

## 2021-01-30 ENCOUNTER — Telehealth: Payer: Self-pay | Admitting: *Deleted

## 2021-01-30 ENCOUNTER — Other Ambulatory Visit: Payer: Self-pay | Admitting: Family Medicine

## 2021-01-30 DIAGNOSIS — I10 Essential (primary) hypertension: Secondary | ICD-10-CM

## 2021-01-30 NOTE — Telephone Encounter (Signed)
Patient called and left message in triage for a call from the nurse. I called and was not able to leave a message her voicemail is not setup.

## 2021-03-27 ENCOUNTER — Ambulatory Visit: Payer: Self-pay | Admitting: Family Medicine

## 2021-04-11 ENCOUNTER — Ambulatory Visit (INDEPENDENT_AMBULATORY_CARE_PROVIDER_SITE_OTHER): Payer: Self-pay | Admitting: Obstetrics & Gynecology

## 2021-04-11 ENCOUNTER — Other Ambulatory Visit: Payer: Self-pay

## 2021-04-11 ENCOUNTER — Ambulatory Visit (INDEPENDENT_AMBULATORY_CARE_PROVIDER_SITE_OTHER): Payer: Self-pay

## 2021-04-11 ENCOUNTER — Encounter: Payer: Self-pay | Admitting: Obstetrics & Gynecology

## 2021-04-11 ENCOUNTER — Telehealth: Payer: Self-pay

## 2021-04-11 VITALS — BP 134/90

## 2021-04-11 DIAGNOSIS — D500 Iron deficiency anemia secondary to blood loss (chronic): Secondary | ICD-10-CM

## 2021-04-11 DIAGNOSIS — N920 Excessive and frequent menstruation with regular cycle: Secondary | ICD-10-CM

## 2021-04-11 DIAGNOSIS — D259 Leiomyoma of uterus, unspecified: Secondary | ICD-10-CM

## 2021-04-11 NOTE — Progress Notes (Signed)
  Natalie Cervantes 09-24-1976 [id number]        45 y.o.  [id number]   RP: Fibroids with menorrhagia for Pelvic US  HPI: No change x last visit.  Menometrorrhagia worsening in the last few months.  Known uterine Fibroids.  Hb was at 11.7 in 04/2020.  Per patient, BCPs didn't help in the past.  Last Pelvic US 12/2017 showed a uterine size about 12 cm with 3 Fibroids measured from 1-3 cm in diameter.  Mild pelvic discomfort and Dysmenorrhea.   OB History  Gravida Para Term Preterm AB Living  7 3 3  0 4 3  SAB IAB Ectopic Multiple Live Births  4 0 0 0 1    # Outcome Date GA Lbr Len/2nd Weight Sex Delivery Anes PTL Lv  7 Term 07/18/12 [redacted]w[redacted]d 06:02 / 00:26 4 lb 12.9 oz (2.18 kg) F Vag-Spont EPI  LIV  6 SAB           5 SAB           4 SAB           3 SAB           2 Term           1 Term             Past medical history,surgical history, problem list, medications, allergies, family history and social history were all reviewed and documented in the EPIC chart.   Directed ROS with pertinent positives and negatives documented in the history of present illness/assessment and plan.  Exam:  Vitals:   04/11/21 0947  BP: 134/90   General appearance:  Normal  Pelvic US today: T/V images.  Anteverted uterus enlarged with multiple fibroids.  The uterus is measured at 11.7 x 8.01 x 5.52 cm.  The largest fibroid is intramural measured at 4.6 x 4.2 cm.  The other fibroids are subserosal and intramural.  The endometrial lining is thin measured at 2.3 mm with mild clear fluid.  The posterior fibroid is encroaching and distorting the lower uterine segment of the uterine cavity.  Both ovaries are normal in size with left ovarian follicles.  No adnexal mass seen.  No free fluid in the posterior cul-de-sac.   Assessment/Plan:  45 y.o. [id number]   1. Menorrhagia with regular cycles Pelvic US findings thoroughly reviewed with patient.  Counseling on Fibroids and Menorrhagia done.  Patient declines further attempts  to control with hormonal therapies.  Decision to proceed with surgical management.  XI Robotic Total Laparoscopic Hysterectomy with Bilateral Salpingectomy.  Surgery and risks reviewed.  F/U Preop visit.  2. Uterine leiomyoma, unspecified location As above.  3. Iron deficiency anemia due to chronic blood loss Iron supplement recommended.  Princess Bruins MD, 10:13 AM 04/11/2021

## 2021-04-11 NOTE — Telephone Encounter (Signed)
-----   Message from Ramond Craver, [place] sent at 04/11/2021 10:26 AM EDT ----- Regarding: FW: Schedule surgery  ----- Message ----- From: Princess Bruins, MD Sent: 04/11/2021  10:18 AM EDT To: Ramond Craver, RMA, Burnice Logan, RN Subject: Schedule surgery                               Surgery:  XI Robotic Total Laparoscopic Hysterectomy with Bilateral Salpingectomy  Diagnosis: Fibroids/Menorrhagia/Anemia  Location: [facility]  Status: Outpatient with Overnight Bed  Time: 120 Minutes  Assistant: First Available Provider  Urgency: First Available  Pre-Op Appointment: To Be Scheduled  Post-Op Appointment(s): 2 Weeks, 6 Weeks  Time Out Of Work: 6 Weeks

## 2021-04-12 NOTE — Telephone Encounter (Signed)
Call placed to patient to review possible surgery dates. Left message next available dates 7/26 or 8/3.   Please return call to office to further discuss.    Dr. Dellis Filbert -see message below from [facility] regarding RX and advise.

## 2021-04-12 NOTE — Telephone Encounter (Signed)
Spoke with patient regarding surgery benefits. Patient acknowledges understanding of information presented. Patient is aware that benefits presented are professional benefits only. Patient is aware that once surgery is scheduled, the hospital will call with separate benefits.  Routing to Glorianne Manchester, Therapist, sports, for surgery scheduling. Patient is requesting a few dates to pick from, to see what will work with her schedule.  Patient stated that she was told yesterday at her appointment that a prescription would be called into the pharmacy to assist with bleeding. Patient stated that the pharmacy did not have the prescription.

## 2021-04-14 ENCOUNTER — Encounter: Payer: Self-pay | Admitting: Obstetrics & Gynecology

## 2021-04-22 NOTE — Telephone Encounter (Signed)
Spoke with patient, reviewed surgery dates and Covid requirements. Patient request to proceed with surgery on 07/02/21. Advised patient I will return call once confirmed. Patient agreeable.   Patient states she discussed medication at last OV to help with cycle control and bleeding. Patient is requesting Rx to pharmacy on file. Advised I will forward request to Dr. Dellis Filbert for review. Advised Dr. Dellis Filbert is out of the office, response may not be immediate. Patient is not currently on menses.   Dr. Dellis Filbert -please review and advise on Rx.   Cc: KimAlexis

## 2021-04-23 MED ORDER — MEGESTROL ACETATE 40 MG PO TABS: 40.0000 mg | ORAL_TABLET | Freq: Two times a day (BID) | ORAL | 0 refills | Status: DC

## 2021-04-23 NOTE — Telephone Encounter (Signed)
Natalie Bruins, MD  You 2 hours ago (9:02 AM)     Agree with Megace 40 mg PO BID until surgery.      Spoke with patient. Surgery date request confirmed.  Advised surgery is scheduled for 07/02/21, 1015 at [facility].  Surgery instruction sheet and hospital brochure reviewed, printed copy will be mailed. Patient advised of Covid screening and quarantine requirements and agreeable.  Pre-op scheduled for 06/13/21 at 1100. Patient advised of Rx as seen above. Rx sent to verified pharmacy. Patient read back instructions.   Routing to provider. Encounter closed.   Cc: KimAlexis

## 2021-05-04 ENCOUNTER — Other Ambulatory Visit: Payer: Self-pay | Admitting: Family Medicine

## 2021-05-04 DIAGNOSIS — N920 Excessive and frequent menstruation with regular cycle: Secondary | ICD-10-CM

## 2021-05-04 DIAGNOSIS — D500 Iron deficiency anemia secondary to blood loss (chronic): Secondary | ICD-10-CM

## 2021-05-04 DIAGNOSIS — I10 Essential (primary) hypertension: Secondary | ICD-10-CM

## 2021-05-04 NOTE — Telephone Encounter (Signed)
Requested medication (s) are due for refill today: yes  Requested medication (s) are on the active medication list: yes  Last refill:  HCTZ: 01/30/21            Iron: 05/18/20  Future visit scheduled: no  Notes to clinic:  called pt and LM on VM to call office for appt- call back number provided   Requested Prescriptions  Pending Prescriptions Disp Refills   hydrochlorothiazide (HYDRODIURIL) 25 MG tablet [Pharmacy Med Name: hydroCHLOROthiazide 25 MG Oral Tablet] 90 tablet 0    Sig: Take 1 tablet by mouth once daily      Cardiovascular: Diuretics - Thiazide Failed - 05/04/2021 11:16 AM      Failed - Ca in normal range and within 360 days    Calcium  Date Value Ref Range Status  08/31/2019 8.8 8.7 - 10.2 mg/dL Final          Failed - Cr in normal range and within 360 days    Creatinine, Ser  Date Value Ref Range Status  08/31/2019 0.91 0.57 - 1.00 mg/dL Final          Failed - K in normal range and within 360 days    Potassium  Date Value Ref Range Status  08/31/2019 3.8 3.5 - 5.2 mmol/L Final          Failed - Na in normal range and within 360 days    Sodium  Date Value Ref Range Status  08/31/2019 140 134 - 144 mmol/L Final          Failed - Last BP in normal range    BP Readings from Last 1 Encounters:  04/11/21 134/90          Failed - Valid encounter within last 6 months    Recent Outpatient Visits           1 year ago Irregular menses   [place], [place], [place]   1 year ago Need for influenza vaccination   [place], Stephen L, RPH-CPP   1 year ago Class 3 severe obesity due to excess calories without serious comorbidity with body mass index (BMI) of 40.0 to 44.9 in adult [facility])   [facility] [place], Charlane Ferretti, MD   2 years ago Essential hypertension   [place], Charlane Ferretti, MD   3 years ago Essential  hypertension   [place], Charlane Ferretti, MD                  Ferrous Sulfate (IRON) 325 (65 Fe) MG TABS [Pharmacy Med Name: Iron 325 (65 Fe) MG Oral Tablet] 180 tablet 0    Sig: Take 1 tablet by mouth twice daily      Endocrinology:  Minerals - Iron Supplementation Failed - 05/04/2021 11:16 AM      Failed - HGB in normal range and within 360 days    Hemoglobin  Date Value Ref Range Status  12/28/2020 9.7 (L) 11.7 - 15.5 g/dL Final  04/11/2020 11.7 11.1 - 15.9 g/dL Final          Failed - HCT in normal range and within 360 days    HCT  Date Value Ref Range Status  12/28/2020 32.1 (L) 35.0 - 45.0 % Final   Hematocrit  Date Value Ref Range Status  04/11/2020 36.6 34.0 - 46.6 % Final  Failed - Fe (serum) in normal range and within 360 days    No results found for: IRON, IRONPCTSAT        Failed - Ferritin in normal range and within 360 days    No results found for: FERRITIN        Failed - Valid encounter within last 12 months    Recent Outpatient Visits           1 year ago Irregular menses   [place], [place], [place]   1 year ago Need for influenza vaccination   [place], RPH-CPP   1 year ago Class 3 severe obesity due to excess calories without serious comorbidity with body mass index (BMI) of 40.0 to 44.9 in adult [facility])   [facility] Charlott Rakes, MD   2 years ago Essential hypertension   [place], Charlane Ferretti, MD   3 years ago Essential hypertension   [place], [place], MD                Passed - RBC in normal range and within 360 days    RBC  Date Value Ref Range Status  12/28/2020 4.07 3.80 - 5.10 Million/uL Final

## 2021-06-13 ENCOUNTER — Ambulatory Visit (INDEPENDENT_AMBULATORY_CARE_PROVIDER_SITE_OTHER): Payer: Self-pay | Admitting: Obstetrics & Gynecology

## 2021-06-13 ENCOUNTER — Ambulatory Visit: Payer: Self-pay | Admitting: Obstetrics & Gynecology

## 2021-06-13 ENCOUNTER — Other Ambulatory Visit: Payer: Self-pay

## 2021-06-13 ENCOUNTER — Encounter: Payer: Self-pay | Admitting: Obstetrics & Gynecology

## 2021-06-13 ENCOUNTER — Telehealth: Payer: Self-pay | Admitting: *Deleted

## 2021-06-13 VITALS — BP 116/80 | HR 53 | Resp 16 | Ht 63.25 in | Wt 245.0 lb

## 2021-06-13 DIAGNOSIS — D500 Iron deficiency anemia secondary to blood loss (chronic): Secondary | ICD-10-CM

## 2021-06-13 DIAGNOSIS — D259 Leiomyoma of uterus, unspecified: Secondary | ICD-10-CM

## 2021-06-13 DIAGNOSIS — N920 Excessive and frequent menstruation with regular cycle: Secondary | ICD-10-CM

## 2021-06-13 LAB — CBC
HCT: 35.8 % (ref 35.0–45.0)
Hemoglobin: 11.2 g/dL — ABNORMAL LOW (ref 11.7–15.5)
MCH: 25.7 pg — ABNORMAL LOW (ref 27.0–33.0)
MCHC: 31.3 g/dL — ABNORMAL LOW (ref 32.0–36.0)
MCV: 82.3 fL (ref 80.0–100.0)
MPV: 10.9 fL (ref 7.5–12.5)
Platelets: 247 10*3/uL (ref 140–400)
RBC: 4.35 10*6/uL (ref 3.80–5.10)
RDW: 14.7 % (ref 11.0–15.0)
WBC: 5.8 10*3/uL (ref 3.8–10.8)

## 2021-06-13 NOTE — Telephone Encounter (Signed)
Patient was seen in office today for pre-op. Patient request to change surgery to Hysteroscopy D&C [facility].   Advised patient business office will review benefits and return call and I will review surgery scheduling options and return call.   Patient agreeable.   Routing to [employer].

## 2021-06-13 NOTE — Progress Notes (Signed)
Natalie Cervantes 1976/01/21 [id number]        45 y.o.   [id number] Accompanied by husband   RP:  Preop visit Fibroids with menorrhagia for XI Robotic TLH/Bilateral Salpingectomy   HPI: No change x last visit.  Menometrorrhagia worsening in the last few months.  Known uterine Fibroids.  Hb was at 11.7 in 04/2020.  Per patient, BCPs didn't help in the past.  Mild pelvic discomfort and Dysmenorrhea.    OB History  Gravida Para Term Preterm AB Living  7 3 3  0 4 3  SAB IAB Ectopic Multiple Live Births  4 0 0 0 3    # Outcome Date GA Lbr Len/2nd Weight Sex Delivery Anes PTL Lv  7 Term 07/18/12 [redacted]w[redacted]d 06:02 / 00:26 4 lb 12.9 oz (2.18 kg) F Vag-Spont EPI  LIV  6 SAB           5 SAB           4 SAB           3 SAB           2 Term           1 Term             Past medical history,surgical history, problem list, medications, allergies, family history and social history were all reviewed and documented in the EPIC chart.   Directed ROS with pertinent positives and negatives documented in the history of present illness/assessment and plan.  Exam:  Vitals:   06/13/21 1207  BP: 116/80  Pulse: (!) 53  Resp: 16  Weight: 245 lb (111.1 kg)  Height: 5' 3.25" (1.607 m)   General appearance:  Normal  Pelvic US 04/11/2021: T/V images.  Anteverted uterus enlarged with multiple fibroids.  The uterus is measured at 11.7 x 8.01 x 5.52 cm.  The largest fibroid is intramural measured at 4.6 x 4.2 cm.  The other fibroids are subserosal and intramural.  The endometrial lining is thin measured at 2.3 mm with mild clear fluid.  The posterior fibroid is encroaching and distorting the lower uterine segment of the uterine cavity.  Both ovaries are normal in size with left ovarian follicles.  No adnexal mass seen.  No free fluid in the posterior cul-de-sac.     Assessment/Plan:  45 y.o. [id number]   1. Uterine leiomyoma, unspecified location Pelvic US findings thoroughly reviewed with patient at last visit.   Counseling on Fibroids and Menorrhagia done.  Patient declines further attempts to control with hormonal therapies.  Decision to proceed with surgical management.  Prefers preservation of her uterus.  Decision to proceed with HSC/D+C/Sonata Procedure.  Surgery and risks reviewed thoroughly.  Pamphlet given.   2. Menorrhagia with regular cycles Continue on Megestrol and Iron supplements until surgery - CBC   3. Iron deficiency anemia due to chronic blood loss Iron supplement recommended. - CBC                        Patient was counseled as to the risk of surgery to include the following:  1. Infection (prohylactic antibiotics will be administered)  2. DVT/Pulmonary Embolism (prophylactic pneumo compression stockings will be used)  3.Trauma to internal organs requiring additional surgical procedure to repair any injury to internal organs requiring perhaps additional hospitalization days.  4.Hemmorhage requiring transfusion and blood products which carry risks such as anaphylactic reaction, hepatitis and AIDS  Patient had received literature information on the  procedure scheduled and all her questions were answered and fully accepts all risk.  Princess Bruins MD, 12:19 PM 06/13/2021

## 2021-06-13 NOTE — Telephone Encounter (Signed)
Surgery updated to [facility].  Spoke with Myriam Jacobson in central scheduling.  Surgery updated.   Call placed to patient. Left detailed message, ok per dpr. Advised surgery date will remain on 07/02/21, time 1030, arrive at 0830 [facility]. Return call to [place] at [phone] to confirm.

## 2021-06-13 NOTE — Telephone Encounter (Signed)
Spoke with patient regarding surgery benefits. Patient acknowledges understanding of information presented. Patient is aware that benefits presented are professional benefits only. Patient is aware that once surgery is scheduled, the hospital will call with separate benefits. See account note.

## 2021-06-14 NOTE — Telephone Encounter (Signed)
Spoke with patient. Confirmed surgery date and time confirmed.   [place], 07/02/21, 1030, arrive at 0830.   Previously reviewed pre-op instructions, patient has received packet in mail.   Patient has questions regarding hospital surgery benefits, instructed patient to contact [place] to further discuss, number provided.   Patient verbalizes understanding and is agreeable.   Routing to provider for final review. Patient is agreeable to disposition. Will close encounter.   Cc: [employer]

## 2021-06-16 ENCOUNTER — Encounter: Payer: Self-pay | Admitting: Obstetrics & Gynecology

## 2021-06-28 ENCOUNTER — Encounter (HOSPITAL_COMMUNITY)
Admission: RE | Admit: 2021-06-28 | Discharge: 2021-06-28 | Disposition: A | Discharge status: Home / Self Care | Payer: Self-pay | Source: Ambulatory Visit | Attending: Obstetrics & Gynecology | Admitting: Obstetrics & Gynecology

## 2021-06-28 ENCOUNTER — Other Ambulatory Visit (HOSPITAL_COMMUNITY): Payer: Self-pay

## 2021-06-28 ENCOUNTER — Other Ambulatory Visit: Payer: Self-pay

## 2021-06-28 ENCOUNTER — Encounter (HOSPITAL_BASED_OUTPATIENT_CLINIC_OR_DEPARTMENT_OTHER): Payer: Self-pay | Admitting: Obstetrics & Gynecology

## 2021-06-28 DIAGNOSIS — Z01818 Encounter for other preprocedural examination: Secondary | ICD-10-CM | POA: Insufficient documentation

## 2021-06-28 DIAGNOSIS — Z8679 Personal history of other diseases of the circulatory system: Secondary | ICD-10-CM

## 2021-06-28 HISTORY — DX: Personal history of other diseases of the circulatory system: Z86.79

## 2021-06-28 LAB — CBC
HCT: 36.8 % (ref 36.0–46.0)
Hemoglobin: 11.4 g/dL — ABNORMAL LOW (ref 12.0–15.0)
MCH: 25.3 pg — ABNORMAL LOW (ref 26.0–34.0)
MCHC: 31 g/dL (ref 30.0–36.0)
MCV: 81.8 fL (ref 80.0–100.0)
Platelets: 243 10*3/uL (ref 150–400)
RBC: 4.5 MIL/uL (ref 3.87–5.11)
RDW: 16.3 % — ABNORMAL HIGH (ref 11.5–15.5)
WBC: 6.4 10*3/uL (ref 4.0–10.5)
nRBC: 0 % (ref 0.0–0.2)

## 2021-06-28 LAB — BASIC METABOLIC PANEL
Anion gap: 6 (ref 5–15)
BUN: 12 mg/dL (ref 6–20)
CO2: 23 mmol/L (ref 22–32)
Calcium: 8.7 mg/dL — ABNORMAL LOW (ref 8.9–10.3)
Chloride: 110 mmol/L (ref 98–111)
Creatinine, Ser: 0.81 mg/dL (ref 0.44–1.00)
GFR, Estimated: >60 mL/min (ref >60)
Glucose, Bld: 93 mg/dL (ref 70–99)
Potassium: 3.9 mmol/L (ref 3.5–5.1)
Sodium: 139 mmol/L (ref 135–145)

## 2021-06-28 NOTE — Progress Notes (Signed)
Spoke w/ via phone for pre-op interview---pt Lab needs dos---- urine preg              Lab results------has lab appt 06-28-2021 at 1300 for cbc bmp t & s ekg COVID test -----patient states asymptomatic no test needed Arrive at -------830 am 07-02-2021 NPO after MN NO Solid Food.  Clear liquids from MN until---730 am Med rec completed Medications to take morning of surgery -----megace Diabetic medication -----n/a Patient instructed no nail polish to be worn day of surgery Patient instructed to bring photo id and insurance card day of surgery Patient aware to have Driver (ride ) / caregiver pt aware and will arrange driver/caregiver for day of surgery    for 24 hours after surgery  Patient Special Instructions -----none Pre-Op special Istructions -----none Patient verbalized understanding of instructions that were given at this phone interview. Patient denies shortness of breath, chest pain, fever, cough at this phone interview.

## 2021-07-01 NOTE — Anesthesia Preprocedure Evaluation (Addendum)
Anesthesia Evaluation  Patient identified by MRN, date of birth, ID band Patient awake    Reviewed: Allergy & Precautions, NPO status , Patient's Chart, lab work & pertinent test results  Airway Mallampati: II  TM Distance: >3 FB Neck ROM: Full    Dental no notable dental hx. (+) Teeth Intact, Dental Advisory Given   Pulmonary neg pulmonary ROS,    Pulmonary exam normal breath sounds clear to auscultation       Cardiovascular Exercise Tolerance: Good hypertension, Normal cardiovascular exam Rhythm:Regular Rate:Normal     Neuro/Psych negative neurological ROS  negative psych ROS   GI/Hepatic negative GI ROS, Neg liver ROS,   Endo/Other  Morbid obesity (BMI 43,90)  Renal/GU negative Renal ROSLab Results      Component                Value               Date                      CREATININE               0.81                06/28/2021                BUN                      12                  06/28/2021                NA                       139                 06/28/2021                K                        3.9                 06/28/2021                CL                       110                 06/28/2021                CO2                      23                  06/28/2021           Lab Results      Component                Value               Date                      CREATININE               0.81                06/28/2021  BUN                      12                  06/28/2021                NA                       139                 06/28/2021                K                        3.9                 06/28/2021                CL                       110                 06/28/2021                CO2                      23                  06/28/2021                Musculoskeletal   Abdominal (+) + obese,   Peds  Hematology  (+) anemia , Fe deficiency anemia Lab Results       Component                Value               Date                      WBC                      6.4                 06/28/2021                HGB                      11.4 (L)            06/28/2021                HCT                      36.8                06/28/2021                MCV                      81.8                06/28/2021                PLT                      243  06/28/2021              Anesthesia Other Findings   Reproductive/Obstetrics negative OB ROS                            Anesthesia Physical Anesthesia Plan  ASA: 3  Anesthesia Plan: General   Post-op Pain Management:    Induction: Intravenous  PONV Risk Score and Plan: 4 or greater and Treatment may vary due to age or medical condition, Ondansetron and Midazolam  Airway Management Planned: LMA  Additional Equipment: None  Intra-op Plan:   Post-operative Plan:   Informed Consent: I have reviewed the patients History and Physical, chart, labs and discussed the procedure including the risks, benefits and alternatives for the proposed anesthesia with the patient or authorized representative who has indicated his/her understanding and acceptance.     Dental advisory given  Plan Discussed with:   Anesthesia Plan Comments:        Anesthesia Quick Evaluation

## 2021-07-02 ENCOUNTER — Encounter: Payer: Self-pay | Admitting: Obstetrics & Gynecology

## 2021-07-02 ENCOUNTER — Ambulatory Visit (HOSPITAL_BASED_OUTPATIENT_CLINIC_OR_DEPARTMENT_OTHER)
Admission: RE | Admit: 2021-07-02 | Discharge: 2021-07-02 | Disposition: A | Discharge status: Home / Self Care | Payer: Self-pay | Attending: Obstetrics & Gynecology | Admitting: Obstetrics & Gynecology

## 2021-07-02 ENCOUNTER — Encounter (HOSPITAL_BASED_OUTPATIENT_CLINIC_OR_DEPARTMENT_OTHER)
Admission: RE | Disposition: A | Discharge status: Home / Self Care | Payer: Self-pay | Source: Home / Self Care | Attending: Obstetrics & Gynecology

## 2021-07-02 ENCOUNTER — Ambulatory Visit (HOSPITAL_BASED_OUTPATIENT_CLINIC_OR_DEPARTMENT_OTHER): Payer: Self-pay | Admitting: Anesthesiology

## 2021-07-02 ENCOUNTER — Encounter (HOSPITAL_BASED_OUTPATIENT_CLINIC_OR_DEPARTMENT_OTHER): Payer: Self-pay | Admitting: Obstetrics & Gynecology

## 2021-07-02 DIAGNOSIS — Z9889 Other specified postprocedural states: Secondary | ICD-10-CM

## 2021-07-02 DIAGNOSIS — D259 Leiomyoma of uterus, unspecified: Secondary | ICD-10-CM

## 2021-07-02 DIAGNOSIS — N946 Dysmenorrhea, unspecified: Secondary | ICD-10-CM | POA: Insufficient documentation

## 2021-07-02 DIAGNOSIS — N920 Excessive and frequent menstruation with regular cycle: Secondary | ICD-10-CM | POA: Insufficient documentation

## 2021-07-02 DIAGNOSIS — D649 Anemia, unspecified: Secondary | ICD-10-CM | POA: Insufficient documentation

## 2021-07-02 DIAGNOSIS — D500 Iron deficiency anemia secondary to blood loss (chronic): Secondary | ICD-10-CM

## 2021-07-02 DIAGNOSIS — Z79899 Other long term (current) drug therapy: Secondary | ICD-10-CM | POA: Insufficient documentation

## 2021-07-02 DIAGNOSIS — Z791 Long term (current) use of non-steroidal anti-inflammatories (NSAID): Secondary | ICD-10-CM | POA: Insufficient documentation

## 2021-07-02 HISTORY — PX: DILATION AND CURETTAGE OF UTERUS: SHX78

## 2021-07-02 HISTORY — DX: Anemia, unspecified: D64.9

## 2021-07-02 LAB — POCT PREGNANCY, URINE: Preg Test, Ur: NEGATIVE

## 2021-07-02 LAB — TYPE AND SCREEN
ABO/RH(D): A POS
Antibody Screen: NEGATIVE

## 2021-07-02 SURGERY — DILATION AND CURETTAGE
Anesthesia: General | Site: Uterus

## 2021-07-02 MED ORDER — SODIUM CHLORIDE (PF) 0.9 % IJ SOLN
INTRAMUSCULAR | Status: AC
  Filled 2021-07-02: qty 10

## 2021-07-02 MED ORDER — LACTATED RINGERS IV SOLN: INTRAVENOUS | Status: DC

## 2021-07-02 MED ORDER — FENTANYL CITRATE (PF) 100 MCG/2ML IJ SOLN
INTRAMUSCULAR | Status: AC
  Filled 2021-07-02: qty 2

## 2021-07-02 MED ORDER — DEXAMETHASONE SODIUM PHOSPHATE 10 MG/ML IJ SOLN
INTRAMUSCULAR | Status: DC | PRN
  Administered 2021-07-02: 10 mg via INTRAVENOUS

## 2021-07-02 MED ORDER — FENTANYL CITRATE (PF) 100 MCG/2ML IJ SOLN
INTRAMUSCULAR | Status: DC | PRN
  Administered 2021-07-02: 25 ug via INTRAVENOUS
  Administered 2021-07-02 (×3): 50 ug via INTRAVENOUS
  Administered 2021-07-02: 25 ug via INTRAVENOUS
  Administered 2021-07-02: 50 ug via INTRAVENOUS

## 2021-07-02 MED ORDER — POVIDONE-IODINE 10 % EX SWAB: 2.0000 "application " | Freq: Once | CUTANEOUS | Status: DC

## 2021-07-02 MED ORDER — OXYCODONE HCL 5 MG PO TABS
5.0000 mg | ORAL_TABLET | Freq: Once | ORAL | Status: AC | PRN
  Administered 2021-07-02: 5 mg via ORAL

## 2021-07-02 MED ORDER — ONDANSETRON HCL 4 MG/2ML IJ SOLN: 4.0000 mg | Freq: Once | INTRAMUSCULAR | Status: DC | PRN

## 2021-07-02 MED ORDER — DEXAMETHASONE SODIUM PHOSPHATE 10 MG/ML IJ SOLN
INTRAMUSCULAR | Status: AC
  Filled 2021-07-02: qty 1

## 2021-07-02 MED ORDER — CHLOROPROCAINE HCL 1 % IJ SOLN
INTRAMUSCULAR | Status: DC | PRN
  Administered 2021-07-02: 20 mL

## 2021-07-02 MED ORDER — KETOROLAC TROMETHAMINE 30 MG/ML IJ SOLN: 30.0000 mg | Freq: Once | INTRAMUSCULAR | Status: DC | PRN

## 2021-07-02 MED ORDER — MIDAZOLAM HCL 5 MG/5ML IJ SOLN
INTRAMUSCULAR | Status: DC | PRN
  Administered 2021-07-02: 2 mg via INTRAVENOUS

## 2021-07-02 MED ORDER — OXYCODONE HCL 5 MG/5ML PO SOLN: 5.0000 mg | Freq: Once | ORAL | Status: AC | PRN

## 2021-07-02 MED ORDER — ONDANSETRON HCL 4 MG/2ML IJ SOLN
INTRAMUSCULAR | Status: AC
  Filled 2021-07-02: qty 2

## 2021-07-02 MED ORDER — CHLORHEXIDINE GLUCONATE 0.12 % MT SOLN: 15.0000 mL | Freq: Once | OROMUCOSAL | Status: DC

## 2021-07-02 MED ORDER — STERILE WATER FOR INJECTION IJ SOLN
INTRAMUSCULAR | Status: DC | PRN
  Administered 2021-07-02: 10 mL

## 2021-07-02 MED ORDER — SODIUM CHLORIDE 0.9 % IV SOLN
2.0000 g | INTRAVENOUS | Status: AC
  Administered 2021-07-02: 2 g via INTRAVENOUS

## 2021-07-02 MED ORDER — IBUPROFEN 800 MG PO TABS: 800.0000 mg | ORAL_TABLET | Freq: Three times a day (TID) | ORAL | 0 refills | Status: DC | PRN

## 2021-07-02 MED ORDER — ORAL CARE MOUTH RINSE: 15.0000 mL | Freq: Once | OROMUCOSAL | Status: DC

## 2021-07-02 MED ORDER — HYDROMORPHONE HCL 1 MG/ML IJ SOLN: 0.2500 mg | INTRAMUSCULAR | Status: DC | PRN

## 2021-07-02 MED ORDER — AMISULPRIDE (ANTIEMETIC) 5 MG/2ML IV SOLN: 10.0000 mg | Freq: Once | INTRAVENOUS | Status: DC | PRN

## 2021-07-02 MED ORDER — MIDAZOLAM HCL 2 MG/2ML IJ SOLN
INTRAMUSCULAR | Status: AC
  Filled 2021-07-02: qty 2

## 2021-07-02 MED ORDER — OXYCODONE HCL 5 MG PO TABS
ORAL_TABLET | ORAL | Status: AC
  Filled 2021-07-02: qty 1

## 2021-07-02 MED ORDER — ONDANSETRON HCL 4 MG/2ML IJ SOLN
INTRAMUSCULAR | Status: DC | PRN
Start: 2021-07-02 — End: 2021-07-02
  Administered 2021-07-02: 4 mg via INTRAVENOUS

## 2021-07-02 MED ORDER — SODIUM CHLORIDE 0.9 % IV SOLN
INTRAVENOUS | Status: AC
  Filled 2021-07-02: qty 2

## 2021-07-02 MED ORDER — PROPOFOL 10 MG/ML IV BOLUS
INTRAVENOUS | Status: DC | PRN
  Administered 2021-07-02: 150 mg via INTRAVENOUS
  Administered 2021-07-02 (×2): 50 mg via INTRAVENOUS
  Administered 2021-07-02: 40 mg via INTRAVENOUS

## 2021-07-02 MED ORDER — LIDOCAINE HCL (PF) 2 % IJ SOLN
INTRAMUSCULAR | Status: AC
  Filled 2021-07-02: qty 5

## 2021-07-02 MED ORDER — PROPOFOL 10 MG/ML IV BOLUS
INTRAVENOUS | Status: AC
  Filled 2021-07-02: qty 20

## 2021-07-02 MED ORDER — KETOROLAC TROMETHAMINE 30 MG/ML IJ SOLN
INTRAMUSCULAR | Status: DC | PRN
  Administered 2021-07-02: 30 mg via INTRAVENOUS

## 2021-07-02 MED ORDER — DEXMEDETOMIDINE (PRECEDEX) IN NS 20 MCG/5ML (4 MCG/ML) IV SYRINGE
PREFILLED_SYRINGE | INTRAVENOUS | Status: DC | PRN
  Administered 2021-07-02: 12 ug via INTRAVENOUS

## 2021-07-02 MED ORDER — LIDOCAINE 2% (20 MG/ML) 5 ML SYRINGE
INTRAMUSCULAR | Status: DC | PRN
  Administered 2021-07-02: 60 mg via INTRAVENOUS
  Administered 2021-07-02: 100 mg via INTRAVENOUS

## 2021-07-02 SURGICAL SUPPLY — 17 items
CATH ROBINSON RED A/P 14FR (CATHETERS) ×1 IMPLANT
CATH ROBINSON RED A/P 16FR (CATHETERS) ×2 IMPLANT
ELECT DISPERSIVE SONATA (MISCELLANEOUS) ×4 IMPLANT
GAUZE 4X4 16PLY ~~LOC~~+RFID DBL (SPONGE) ×2 IMPLANT
GLOVE SURG ENC MOIS LTX SZ6.5 (GLOVE) ×2 IMPLANT
GLOVE SURG POLYISO LF SZ7 (GLOVE) ×1 IMPLANT
GLOVE SURG UNDER POLY LF SZ7 (GLOVE) ×4 IMPLANT
GLOVE SURG UNDER POLY LF SZ7.5 (GLOVE) ×1 IMPLANT
GOWN STRL REUS W/TWL LRG LVL3 (GOWN DISPOSABLE) ×4 IMPLANT
HANDPIECE RFA SONATA (MISCELLANEOUS) ×2 IMPLANT
KIT TURNOVER CYSTO (KITS) ×2 IMPLANT
PACK VAGINAL MINOR WOMEN LF (CUSTOM PROCEDURE TRAY) ×2 IMPLANT
PAD OB MATERNITY 4.3X12.25 (PERSONAL CARE ITEMS) ×2 IMPLANT
SURGILUBE 2OZ TUBE FLIPTOP (MISCELLANEOUS) ×2 IMPLANT
SYR 50ML LL SCALE MARK (SYRINGE) ×2 IMPLANT
TOWEL OR 17X26 10 PK STRL BLUE (TOWEL DISPOSABLE) ×2 IMPLANT
WATER STERILE IRR 500ML POUR (IV SOLUTION) ×2 IMPLANT

## 2021-07-02 NOTE — Anesthesia Procedure Notes (Signed)
Procedure Name: LMA Insertion Date/Time: 07/02/2021 10:08 AM Performed by: Rogers Blocker, CRNA Pre-anesthesia Checklist: Patient identified, Emergency Drugs available, Suction available and Patient being monitored Patient Re-evaluated:Patient Re-evaluated prior to induction Oxygen Delivery Method: Circle System Utilized Preoxygenation: Pre-oxygenation with 100% oxygen Induction Type: IV induction Ventilation: Mask ventilation without difficulty LMA: LMA inserted LMA Size: 4.0 Number of attempts: 1 Placement Confirmation: positive ETCO2 Tube secured with: Tape Dental Injury: Teeth and Oropharynx as per pre-operative assessment

## 2021-07-02 NOTE — Discharge Instructions (Addendum)
DISCHARGE INSTRUCTIONS: D&C / D&E The following instructions have been prepared to help you care for yourself upon your return home.   Personal hygiene:  Use sanitary pads for vaginal drainage, not tampons.  Shower the day after your procedure.  NO tub baths, pools or Jacuzzis for 2-3 weeks.  Wipe front to back after using the bathroom.  Activity and limitations:  Do NOT drive or operate any equipment for 24 hours. The effects of anesthesia are still present and drowsiness may result.  Do NOT rest in bed all day.  Walking is encouraged.  Walk up and down stairs slowly.  You may resume your normal activity in one to two days or as indicated by your physician.  Sexual activity: NO intercourse for at least 2 weeks after the procedure, or as indicated by your physician.  Diet: Eat a light meal as desired this evening. You may resume your usual diet tomorrow.  Return to work: You may resume your work activities in one to two days or as indicated by your doctor.  What to expect after your surgery: Expect to have vaginal bleeding/discharge for 2-3 days and spotting for up to 10 days. It is not unusual to have soreness for up to 1-2 weeks. You may have a slight burning sensation when you urinate for the first day. Mild cramps may continue for a couple of days. You may have a regular period in 2-6 weeks.  Call your doctor for any of the following:  Excessive vaginal bleeding, saturating and changing one pad every hour.  Inability to urinate 6 hours after discharge from hospital.  Pain not relieved by pain medication.  Fever of 100.4 F or greater.  Unusual vaginal discharge or odor.   Post Anesthesia Home Care Instructions  Activity: Get plenty of rest for the remainder of the day. A responsible individual must stay with you for 24 hours following the procedure.  For the next 24 hours, DO NOT: -Drive a car -Paediatric nurse -Drink alcoholic beverages -Take any medication unless  instructed by your physician -Make any legal decisions or sign important papers.  Meals: Start with liquid foods such as gelatin or soup. Progress to regular foods as tolerated. Avoid greasy, spicy, heavy foods. If nausea and/or vomiting occur, drink only clear liquids until the nausea and/or vomiting subsides. Call your physician if vomiting continues.  Special Instructions/Symptoms: Your throat may feel dry or sore from the anesthesia or the breathing tube placed in your throat during surgery. If this causes discomfort, gargle with warm salt water. The discomfort should disappear within 24 hours.  No ibuprofen, Advil, Aleve, Motrin, ketorolac, meloxicam, or naproxen until after 6:00 pm today if needed.

## 2021-07-02 NOTE — Anesthesia Postprocedure Evaluation (Signed)
Anesthesia Post Note  Patient: Natalie Cervantes  Procedure(s) Performed: DILATATION AND CURETTAGE (Uterus) Radio Frequency Ablation with Sonata (Uterus)     Patient location during evaluation: PACU Anesthesia Type: General Level of consciousness: awake and alert Pain management: pain level controlled Vital Signs Assessment: post-procedure vital signs reviewed and stable Respiratory status: spontaneous breathing, nonlabored ventilation, respiratory function stable and patient connected to nasal cannula oxygen Cardiovascular status: blood pressure returned to baseline and stable Postop Assessment: no apparent nausea or vomiting Anesthetic complications: no   No notable events documented.  Last Vitals:  Vitals:   07/02/21 1245 07/02/21 1447  BP: (!) 171/91 (!) 173/79  Pulse: (!) 51 (!) 50  Resp: 14 16  Temp: 36.4 C (!) 36.1 C  SpO2: 100% 100%    Last Pain:  Vitals:   07/02/21 1447  TempSrc: Oral  PainSc:                  [place]

## 2021-07-02 NOTE — Op Note (Signed)
Operative Note  07/02/2021  12:16 PM  PATIENT:  Natalie Cervantes  45 y.o. female  PRE-OPERATIVE DIAGNOSIS:  Uterine fibroids, menorrhagia, anemia  POST-OPERATIVE DIAGNOSIS:  Uterine fibroids, menorrhagia, anemi  PROCEDURE:  Procedure(s): DILATATION AND CURETTAGE Radio Frequency Ablation with Sonata  SURGEON:  Surgeon(s): Princess Bruins, MD  ANESTHESIA:   general  FINDINGS: Multiple Intramural/subserosal uterine Fibroids.  Sonata Radio-Ablation x 8 on 6 Fibroids.  DESCRIPTION OF OPERATION: Under general anesthesia with laryngeal mask, the patient is in lithotomy position.  She is prepped with Betadine on the suprapubic, vulvar and vaginal areas.  The bladder is catheterized.  The patient is draped as usual.  Timeout is done.  The vaginal exam reveals an anteverted uterus increased in size with fibroids.  No adnexal mass.  The speculum is inserted in the vagina and the anterior lip of the cervix is grasped with a tenaculum.  A paracervical block is done with Nesacaine 1% a total of 20 cc at 4 and 8:00.  Dilation of the cervix with Pratt dilators up to #27 without difficulty.  A systematic endometrial curettage is done with a sharp curette on all endometrial surfaces.  The specimen is sent to pathology.  The curette is removed.  The Sonata instrument is inserted in the intra uterine cavity.  The speculum is removed.  We do a mapping of the uterine fibroids with a rotation of the Sonata device from 12:00, clockwise, until we return to 12:00.  The patient has many intramural and subserosal fibroids.  We performed 8 Sonata radioablation on 6 fibroids.  Fibroid #1 is 4.5 cm type IV at 9:00 measuring 3.4 x 2.5 cm for 3 minutes and 48 seconds.  Fibroid #2 is 3.5 cm type IV-5 at 9:00 first ablation 3.4 x 2.3 cm for 3 minutes and 12 seconds, second ablation 3.4 x 2.5 for 3 minutes and 48 seconds.  Fibroid #3 is 3 cm type IV at 9:00 ablation is 2.9 x 2.2 cm for 2 minutes and 54 seconds.  Fibroid #4 is 5  cm type IV at 2:00 first ablation 3.3 x 2.4 cm for 3 minutes and 30 seconds and second ablation 3.5 x 2.6 cm for 3 minutes and 50 seconds.  Fibroid #5 is 3.5 cm type IV at 12:00 ablation 3.4 x 2.4 cm for 3 minutes and 30 seconds.  Fibroid #6 is 2 cm type III at 1:00 ablation 2 x 1.4 cm for 1 minute and 12 seconds.  We then removed the [facility] instrument.  We removed the tenaculum from the cervix.  No active bleeding.  No complication occurred and the patient was transferred to recovery room in good and stable status.  ESTIMATED BLOOD LOSS: 10 mL   Intake/Output Summary (Last 24 hours) at 07/02/2021 1216 Last data filed at 07/02/2021 1159 Gross per 24 hour  Intake 1100 ml  Output 385 ml  Net 715 ml     BLOOD ADMINISTERED:none   LOCAL MEDICATIONS USED:  Nesacaine 1% 20 cc Paracervical Block  SPECIMEN:  Source of Specimen:  Endometrial curettings  DISPOSITION OF SPECIMEN:  PATHOLOGY  COUNTS:  YES  PLAN OF CARE: Transfer to PACU  Marie-Lyne LavoieMD12:16 PM

## 2021-07-02 NOTE — Transfer of Care (Signed)
Immediate Anesthesia Transfer of Care Note  Patient: Natalie Cervantes  Procedure(s) Performed: DILATATION AND CURETTAGE (Uterus) Radio Frequency Ablation with Sonata (Uterus)  Patient Location: PACU  Anesthesia Type:General  Level of Consciousness: drowsy, patient cooperative and responds to stimulation  Airway & Oxygen Therapy:  Pt with spontaneous breathing, face mask O2   Post-op Assessment: Report given to RN and Post -op Vital signs reviewed and stable  Post vital signs: Reviewed and stable  Last Vitals:  Vitals Value Taken Time  BP 137/75 07/02/21 1213  Temp    Pulse 82 07/02/21 1215  Resp 21 07/02/21 1215  SpO2 98 % 07/02/21 1215  Vitals shown include unvalidated device data.  Last Pain:  Vitals:   07/02/21 0815  TempSrc: Oral  PainSc: 0-No pain      Patients Stated Pain Goal: 5 ([id number])  Complications: No notable events documented.

## 2021-07-02 NOTE — H&P (Signed)
Natalie Cervantes is an 45 y.o. female. [id number]    RP: Fibroids with Menorrhagia for Sonata procedure with D+C   HPI: No change x last visit.  Menometrorrhagia worsening in the last few months.  Known uterine Fibroids.  Flow better on Megace.  Per patient, BCPs didn't help in the past.  Mild pelvic discomfort and Dysmenorrhea.    Pertinent Gynecological History: Menses: Menorrhagia Blood transfusions: none Sexually transmitted diseases: no past history Previous GYN Procedures: None Last mammogram: normal  Last pap: normal    Menstrual History:  Patient's last menstrual period was 05/14/2021.    Past Medical History:  Diagnosis Date   Anemia    Fibroid    History of hypertension 06/28/2021   no bp meds needed x 1 pear per pt   No pertinent past medical history     Past Surgical History:  Procedure Laterality Date   NO PAST SURGERIES      History reviewed. No pertinent family history.  Social History:  reports that she has never smoked. She has never used smokeless tobacco. She reports that she does not drink alcohol and does not use drugs.  Allergies: No Known Allergies  Medications Prior to Admission  Medication Sig Dispense Refill Last Dose   Ferrous Sulfate (IRON) 325 (65 Fe) MG TABS Take 1 tablet by mouth twice daily 180 tablet 0 07/01/2021   ibuprofen (ADVIL) 800 MG tablet Take 1 tablet (800 mg total) by mouth every 12 (twelve) hours as needed. Take with food. 60 tablet 1 Past Month   megestrol (MEGACE) 40 MG tablet Take 1 tablet (40 mg total) by mouth 2 (two) times daily. 180 tablet 0 07/01/2021   cyclobenzaprine (FLEXERIL) 10 MG tablet Take 1 tablet (10 mg total) by mouth at bedtime. 30 tablet 1 More than a month    REVIEW OF SYSTEMS: A ROS was performed and pertinent positives and negatives are included in the history.  GENERAL: No fevers or chills. HEENT: No change in vision, no earache, sore throat or sinus congestion. NECK: No pain or stiffness. CARDIOVASCULAR:  No chest pain or pressure. No palpitations. PULMONARY: No shortness of breath, cough or wheeze. GASTROINTESTINAL: No abdominal pain, nausea, vomiting or diarrhea, melena or bright red blood per rectum. GENITOURINARY: No urinary frequency, urgency, hesitancy or dysuria. MUSCULOSKELETAL: No joint or muscle pain, no back pain, no recent trauma. DERMATOLOGIC: No rash, no itching, no lesions. ENDOCRINE: No polyuria, polydipsia, no heat or cold intolerance. No recent change in weight. HEMATOLOGICAL: No anemia or easy bruising or bleeding. NEUROLOGIC: No headache, seizures, numbness, tingling or weakness. PSYCHIATRIC: No depression, no loss of interest in normal activity or change in sleep pattern.     Blood pressure (!) 170/96, pulse 63, temperature 98.6 F (37 C), temperature source Oral, resp. rate 17, height '5\' 2"'$  (1.575 m), weight 112.2 kg, last menstrual period 05/14/2021, SpO2 100 %, unknown if currently breastfeeding.  Physical Exam:  Pelvic US 04/11/2021: T/V images.  Anteverted uterus enlarged with multiple fibroids.  The uterus is measured at 11.7 x 8.01 x 5.52 cm.  The largest fibroid is intramural measured at 4.6 x 4.2 cm.  The other fibroids are subserosal and intramural.  The endometrial lining is thin measured at 2.3 mm with mild clear fluid.  The posterior fibroid is encroaching and distorting the lower uterine segment of the uterine cavity.  Both ovaries are normal in size with left ovarian follicles.  No adnexal mass seen.  No free fluid in the posterior cul-de-sac.  Hb 11.4 on 06/28/2021    Assessment/Plan:  45 y.o. [id number]   1. Uterine leiomyoma, unspecified location Pelvic US findings thoroughly reviewed with patient at last visit.  Counseling on Fibroids and Menorrhagia done.  Patient declines further attempts to control with hormonal therapies.  Decision to proceed with surgical management.  Prefers preservation of her uterus.  Decision to proceed with Sonata Procedure with D+C.   Surgery and risks reviewed thoroughly.  Pamphlet given.   2. Menorrhagia with regular cycles Continue on Megestrol and Iron supplements until surgery    3. Iron deficiency anemia due to chronic blood loss Iron supplement recommended.                          Patient was counseled as to the risk of surgery to include the following:   1. Infection (prohylactic antibiotics will be administered)   2. DVT/Pulmonary Embolism (prophylactic pneumo compression stockings will be used)   3.Trauma to internal organs requiring additional surgical procedure to repair any injury to internal organs requiring perhaps additional hospitalization days.   4.Hemmorhage requiring transfusion and blood products which carry risks such as anaphylactic reaction, hepatitis and AIDS   Patient had received literature information on the procedure scheduled and all her questions were answered and fully accepts all risk.  Results for orders placed or performed during the hospital encounter of 07/02/21 (from the past 24 hour(s))  Pregnancy, urine POC     Status: None   Collection Time: 07/02/21  8:00 AM  Result Value Ref Range   Preg Test, Ur NEGATIVE NEGATIVE    Natalie Cervantes 07/02/2021, 9:34 AM

## 2021-07-03 ENCOUNTER — Encounter (HOSPITAL_BASED_OUTPATIENT_CLINIC_OR_DEPARTMENT_OTHER): Payer: Self-pay | Admitting: Obstetrics & Gynecology

## 2021-07-03 LAB — SURGICAL PATHOLOGY

## 2021-07-16 ENCOUNTER — Ambulatory Visit (INDEPENDENT_AMBULATORY_CARE_PROVIDER_SITE_OTHER): Payer: Self-pay | Admitting: Obstetrics & Gynecology

## 2021-07-16 ENCOUNTER — Encounter: Payer: Self-pay | Admitting: Obstetrics & Gynecology

## 2021-07-16 ENCOUNTER — Other Ambulatory Visit: Payer: Self-pay

## 2021-07-16 VITALS — BP 114/74

## 2021-07-16 DIAGNOSIS — Z09 Encounter for follow-up examination after completed treatment for conditions other than malignant neoplasm: Secondary | ICD-10-CM

## 2021-07-16 NOTE — Progress Notes (Signed)
    Natalie Cervantes 08/23/1976 [id number]        45 y.o.  [id number]   RP: Postop HSC/D+C/Sonata Procedure on 07/02/2021  HPI: Excellent postop evolution.  No pelvic pain.  No vaginal bleeding.  No fever.  Urine/BMs normal.   OB History  Gravida Para Term Preterm AB Living  '7 3 3 '$ 0 4 3  SAB IAB Ectopic Multiple Live Births  4 0 0 0 3    # Outcome Date GA Lbr Len/2nd Weight Sex Delivery Anes PTL Lv  7 Term 07/18/12 23w2d06:02 / 00:26 4 lb 12.9 oz (2.18 kg) F Vag-Spont EPI  LIV  6 SAB           5 SAB           4 SAB           3 SAB           2 Term           1 Term             Past medical history,surgical history, problem list, medications, allergies, family history and social history were all reviewed and documented in the EPIC chart.   Directed ROS with pertinent positives and negatives documented in the history of present illness/assessment and plan.  Exam:  Vitals:   07/16/21 1019  BP: 114/74   General appearance:  Normal  Abdomen: Normal  Gynecologic exam: Vulva normal.  Bimanual exam:  Uterus AV, decreased in size, mobile, NT.  No adnexal mass felt.  Patho: FINAL MICROSCOPIC DIAGNOSIS:   A. ENDOMETRIUM, CURETTAGE:  -  Predominantly blood with fragments of benign endometrium with  progestin effect  -  No hyperplasia or malignancy identified     Assessment/Plan:  45y.o. [id number]  1. Status post gynecological surgery, follow-up exam Excellent postop healing.  No complication.  Pathology benign.  Uterus decreased in size post Sonata.  Will reassess by pelvic ultrasound in 6 months. - [place]Transvaginal Non-OB; Future in 6 months  MPrincess BruinsMD, 10:23 AM 07/16/2021

## 2021-07-20 ENCOUNTER — Encounter: Payer: Self-pay | Admitting: Obstetrics & Gynecology

## 2021-07-29 ENCOUNTER — Other Ambulatory Visit: Payer: Self-pay | Admitting: Family Medicine

## 2021-07-29 DIAGNOSIS — D500 Iron deficiency anemia secondary to blood loss (chronic): Secondary | ICD-10-CM

## 2021-07-29 DIAGNOSIS — N920 Excessive and frequent menstruation with regular cycle: Secondary | ICD-10-CM

## 2021-07-29 DIAGNOSIS — I10 Essential (primary) hypertension: Secondary | ICD-10-CM

## 2021-07-29 NOTE — Telephone Encounter (Signed)
Notes to clinic:   The original prescription was discontinued on 06/13/2021 by Susy Manor, CMA for the following reason: Error. Renewing this prescription may not be appropriate  Requested Prescriptions  Pending Prescriptions Disp Refills   hydrochlorothiazide (HYDRODIURIL) 25 MG tablet [Pharmacy Med Name: hydroCHLOROthiazide 25 MG Oral Tablet] 90 tablet 0    Sig: Take 1 tablet by mouth once daily     Cardiovascular: Diuretics - Thiazide Failed - 07/29/2021  9:16 AM      Failed - Ca in normal range and within 360 days    Calcium  Date Value Ref Range Status  06/28/2021 8.7 (L) 8.9 - 10.3 mg/dL Final          Failed - Valid encounter within last 6 months    Recent Outpatient Visits           1 year ago Irregular menses   [facility] [place], [place], [place]   1 year ago Need for influenza vaccination   [facility] Tresa Endo, RPH-CPP   1 year ago Class 3 severe obesity due to excess calories without serious comorbidity with body mass index (BMI) of 40.0 to 44.9 in adult [facility])   [facility] [place], Charlane Ferretti, MD   2 years ago Essential hypertension   [place], [place], MD   3 years ago Essential hypertension   [place], Charlane Ferretti, MD       Future Appointments             In 1 month Charlott Rakes, MD [place] in normal range and within 360 days    Creatinine, Ser  Date Value Ref Range Status  06/28/2021 0.81 0.44 - 1.00 mg/dL Final          Passed - K in normal range and within 360 days    Potassium  Date Value Ref Range Status  06/28/2021 3.9 3.5 - 5.1 mmol/L Final          Passed - Na in normal range and within 360 days    Sodium  Date Value Ref Range Status  06/28/2021 139 135 - 145 mmol/L Final  08/31/2019 140 134 -  144 mmol/L Final          Passed - Last BP in normal range    BP Readings from Last 1 Encounters:  07/16/21 114/74           FEROSUL 325 (65 Fe) MG tablet [Pharmacy Med Name: FeroSul 325 (65 Fe) MG Oral Tablet] 180 tablet 0    Sig: Take 1 tablet by mouth twice daily     Endocrinology:  Minerals - Iron Supplementation Failed - 07/29/2021  9:16 AM      Failed - HGB in normal range and within 360 days    Hemoglobin  Date Value Ref Range Status  06/28/2021 11.4 (L) 12.0 - 15.0 g/dL Final  04/11/2020 11.7 11.1 - 15.9 g/dL Final          Failed - Fe (serum) in normal range and within 360 days    No results found for: IRON, IRONPCTSAT        Failed - Ferritin in normal range and within 360 days    No results found for: FERRITIN  Failed - Valid encounter within last 12 months    Recent Outpatient Visits           1 year ago Irregular menses   [facility] [place], [place], [place]   1 year ago Need for influenza vaccination   [place], RPH-CPP   1 year ago Class 3 severe obesity due to excess calories without serious comorbidity with body mass index (BMI) of 40.0 to 44.9 in adult [facility])   [facility] Charlott Rakes, MD   2 years ago Essential hypertension   [place], Charlane Ferretti, MD   3 years ago Essential hypertension   [place], Charlane Ferretti, MD       Future Appointments             In 1 month Charlott Rakes, MD [place] - HCT in normal range and within 360 days    HCT  Date Value Ref Range Status  06/28/2021 36.8 36.0 - 46.0 % Final   Hematocrit  Date Value Ref Range Status  04/11/2020 36.6 34.0 - 46.6 % Final          Passed - RBC in normal range and within 360 days    RBC  Date Value Ref Range Status  06/28/2021  4.50 3.87 - 5.11 MIL/uL Final

## 2021-08-13 ENCOUNTER — Ambulatory Visit: Payer: Self-pay | Admitting: Obstetrics & Gynecology

## 2021-09-03 ENCOUNTER — Ambulatory Visit: Payer: Self-pay | Admitting: Family Medicine

## 2021-09-25 ENCOUNTER — Ambulatory Visit: Payer: Self-pay

## 2021-09-25 NOTE — Telephone Encounter (Signed)
Pt called in stating that her Right eye is red. She says that her eye was red last week but went away and came back this morning when she woke up. The inside of the eye (the white part) is red. She denies pain to the eye. Doesn't wear contacts. She is unsure of what has caused the redness. Recommended to be seen. Spoke to [place], [facility] via teams regarding appt, she was able to schedule appt for tomorrow @ 0930 with Dr. Margarita Rana. Care advice given and pt verbalized understanding.   Reason for Disposition  [1] Only 1 eye is red AND [2] present > 48 hours  Answer Assessment - Initial Assessment Questions 1. LOCATION: Location: "What's red, the eyeball or the outer eyelids?" (Note: when callers say the eye is red, they usually mean the sclera is red)       Inside of eye, white  2. REDNESS OF SCLERA: "Is the redness in one or both eyes?" "When did the redness start?"      Right, last week went away, came back this morning 3. ONSET: "When did the eye become red?" (e.g., hours, days)      today 4. EYELIDS: "Are the eyelids red or swollen?" If Yes, ask: "How much?"      No 5. VISION: "Is there any difficulty seeing clearly?"      No 6. ITCHING: "Does it feel itchy?" If so ask: "How bad is it" (e.g., Scale 1-10; or mild, moderate, severe)     no 7. PAIN: "Is there any pain? If Yes, ask: "How bad is it?" (e.g., Scale 1-10; or mild, moderate, severe)     slight 8. CONTACT LENS: "Do you wear contacts?"     No 9. CAUSE: "What do you think is causing the redness?"     No 10. OTHER SYMPTOMS: "Do you have any other symptoms?" (e.g., fever, runny nose, cough, vomiting)       No  Protocols used: Eye - Red Without Pus-A-AH

## 2021-09-26 ENCOUNTER — Encounter: Payer: Self-pay | Admitting: Family Medicine

## 2021-09-26 ENCOUNTER — Other Ambulatory Visit: Payer: Self-pay

## 2021-09-26 ENCOUNTER — Ambulatory Visit: Payer: Self-pay | Attending: Family Medicine | Admitting: Family Medicine

## 2021-09-26 VITALS — BP 129/84 | HR 50 | Ht 63.0 in | Wt 249.8 lb

## 2021-09-26 DIAGNOSIS — I10 Essential (primary) hypertension: Secondary | ICD-10-CM

## 2021-09-26 DIAGNOSIS — H1031 Unspecified acute conjunctivitis, right eye: Secondary | ICD-10-CM

## 2021-09-26 DIAGNOSIS — R600 Localized edema: Secondary | ICD-10-CM

## 2021-09-26 DIAGNOSIS — Z23 Encounter for immunization: Secondary | ICD-10-CM

## 2021-09-26 DIAGNOSIS — Z131 Encounter for screening for diabetes mellitus: Secondary | ICD-10-CM

## 2021-09-26 MED ORDER — HYDROCHLOROTHIAZIDE 12.5 MG PO TABS
12.5000 mg | ORAL_TABLET | Freq: Every day | ORAL | 1 refills | Status: DC
Start: 2021-09-26 — End: 2022-05-26

## 2021-09-26 MED ORDER — POLYMYXIN B-TRIMETHOPRIM 10000-0.1 UNIT/ML-% OP SOLN: 1.0000 [drp] | Freq: Four times a day (QID) | OPHTHALMIC | 0 refills | Status: DC

## 2021-09-26 NOTE — Progress Notes (Signed)
She has redness in right eye.  States she is taking HCTZ 0.25mg .

## 2021-09-26 NOTE — Patient Instructions (Signed)
Viral Conjunctivitis, Adult Viral conjunctivitis is an inflammation of the clear membrane that covers the white part of the eye and the inner surface of the eyelid (conjunctiva). The inflammation is caused by a viral infection. The blood vessels in the conjunctiva become enlarged, causing the eye to become red or pink and often itchy. It usually starts in one eye and goes to the other in a day or two. Infections often resolve over 1-2 weeks. Viral conjunctivitis is contagious. This means it can be easily passed from one person to another. This condition is often called pink eye. What are the causes? This condition is caused by a virus. It can be spread by touching objects that have been contaminated with the virus, such as doorknobs or towels, and then touching your eye. It can also be passed through tiny droplets, such as from coughing or sneezing. What increases the risk? You are more likely to develop this condition if you have a cold or the flu, or are in close contact with a person with pink eye. What are the signs or symptoms? Symptoms of this condition include: Redness in the eye. Tearing or watery eyes. Itchy and irritated eyes. Burning feeling in the eyes. Clear drainage from the eye. Swollen eyelids. A gritty feeling in the eye. Light sensitivity. This condition often occurs with other symptoms, such as nasal congestion, cough, and fever. How is this diagnosed? This condition is diagnosed with a medical history and physical exam. If you have discharge from your eye, the discharge may be tested to rule out other causes of conjunctivitis. How is this treated? Viral conjunctivitis does not respond to medicines that kill bacteria (antibiotics). The condition most often resolves on its own in 1-2 weeks. If treatment is needed, it is aimed at relieving your symptoms and preventing the spread of infection. This may be done with artificial tear drops, antihistamine drops, or other eye  medicines. In rare cases, steroid eye drops or anti-herpes virus medicines may be prescribed. Follow these instructions at home: Medicines  Take or apply over-the-counter and prescription medicines only as told by your health care provider. Do not touch the edge of the eyelid with the eye-drop bottle or ointment tube when applying medicines to the affected eye. This will prevent the spread of the infection to the other eye or to other people. Eye care Avoid touching or rubbing your eyes. Apply a clean, cool, wet washcloth onto your eye for 10-20 minutes, 3-4 times per day, or as told by your health care provider. If you wear contact lenses, do not wear them until the inflammation is gone and your health care provider says it is safe to wear them again. Ask your health care provider how to disinfect or replace your contact lenses before using them again. Wear glasses until you can resume wearing contacts. Avoid wearing eye makeup until the inflammation is gone. Throw away any old eye cosmetics that may be contaminated. Gently wipe away any crusting from your eye with a wet washcloth or a cotton ball. General instructions Change or wash your pillowcase every day or as told by your health care provider. Do not share towels, pillowcases, washcloths, eye makeup, makeup brushes, contact lenses, or eyeglasses. This may spread the infection. Wash your hands often with soap and water. Use paper towels to dry your hands. If soap and water are not available, use hand sanitizer. Avoid contact with other people until your eye is no longer red and tearing, or as told by  your health care provider. Contact a health care provider if: Your symptoms do not improve with treatment, or they get worse. You have increased pain. Your vision becomes blurry. You have a fever. You have facial pain, redness, or swelling. You have yellow or green drainage coming from your eye. You have new symptoms. Get help right away  if: You develop severe pain. Your vision gets much worse. Summary Viral conjunctivitis is an inflammation of the clear membrane that covers the white part of the eye and the inner surface of the eyelid. It usually goes away in 1-2 weeks. This condition is usually treated with medicines and cold compresses. Treatment focuses on relieving the symptoms. This condition is very contagious. To prevent infection, avoid close contact with others, wash your hands often, and do not share towels or washcloths. Contact a health care provider if your symptoms do not go away with treatment, or if you have more pain, poor vision, or swelling in the eyes. Get help right away if you have severe pain or your vision gets much worse. This information is not intended to replace advice given to you by your health care provider. Make sure you discuss any questions you have with your health care provider. Document Revised: 10/24/2019 Document Reviewed: 10/07/2019 Elsevier Patient Education  2022 [employer].

## 2021-09-26 NOTE — Progress Notes (Signed)
Subjective:  Patient ID: Natalie Cervantes, female    DOB: 18-Apr-1976  Age: 45 y.o. MRN: [id number]  CC: Eye Pain   HPI Natalie Cervantes is a 45 y.o. year old female with a history of hypertension, obesity, anemia secondary to menorrhagia who presents today for follow-up visit.  She underwent D&C by GYN on 07/02/2021 for menorrhagia and symptoms have improved.  Interval History: R eye redness was noticed last week then resolved but returned yesterday. She has no pain, no itching, no discharge no abnormal  vision. She has no constitutional symptoms and no upper respiratory symptoms.  She has been taking HCTZ as her BP has been elevated around 956, 213 systolic. She was previously on HCTZ for pedal edema and states pedal edema occurs intermittently.  Her hypertension has been diet controlled. Past Medical History:  Diagnosis Date   Anemia    Fibroid    History of hypertension 06/28/2021   no bp meds needed x 1 pear per pt   No pertinent past medical history     Past Surgical History:  Procedure Laterality Date   DILATION AND CURETTAGE OF UTERUS N/A 07/02/2021   Procedure: DILATATION AND CURETTAGE;  Surgeon: Princess Bruins, MD;  Location: [facility];  Service: Gynecology;  Laterality: N/A;   NO PAST SURGERIES      No family history on file.  No Known Allergies  Outpatient Medications Prior to Visit  Medication Sig Dispense Refill   Ferrous Sulfate (IRON) 325 (65 Fe) MG TABS Take 1 tablet by mouth twice daily 180 tablet 0   ibuprofen (ADVIL) 800 MG tablet Take 1 tablet (800 mg total) by mouth every 8 (eight) hours as needed. 20 tablet 0   cyclobenzaprine (FLEXERIL) 10 MG tablet Take 1 tablet (10 mg total) by mouth at bedtime. (Patient not taking: No sig reported) 30 tablet 1   No facility-administered medications prior to visit.     ROS Review of Systems  Constitutional:  Negative for activity change, appetite change and fatigue.  HENT:  Negative for congestion,  sinus pressure and sore throat.   Eyes:  Positive for redness. Negative for visual disturbance.  Respiratory:  Negative for cough, chest tightness, shortness of breath and wheezing.   Cardiovascular:  Negative for chest pain and palpitations.  Gastrointestinal:  Negative for abdominal distention, abdominal pain and constipation.  Endocrine: Negative for polydipsia.  Genitourinary:  Negative for dysuria and frequency.  Musculoskeletal:  Negative for arthralgias and back pain.  Skin:  Negative for rash.  Neurological:  Negative for tremors, light-headedness and numbness.  Hematological:  Does not bruise/bleed easily.  Psychiatric/Behavioral:  Negative for agitation and behavioral problems.    Objective:  BP 129/84   Pulse (!) 50   Ht 5\' 3"  (1.6 m)   Wt 249 lb 12.8 oz (113.3 kg)   SpO2 100%   BMI 44.25 kg/m   BP/Weight 09/26/2021 07/16/2021 0/86/5784  Systolic BP 696 295 284  Diastolic BP 84 74 79  Wt. (Lbs) 249.8 - 247.4  BMI 44.25 - 45.25      Physical Exam Constitutional:      Appearance: She is well-developed.  Eyes:     Extraocular Movements: Extraocular movements intact.     Pupils: Pupils are equal, round, and reactive to light.     Comments: Erythema of medial half of right eye No ocular discharge Left eyes normal  Cardiovascular:     Rate and Rhythm: Bradycardia present.     Heart sounds: Normal  heart sounds. No murmur heard. Pulmonary:     Effort: Pulmonary effort is normal.     Breath sounds: Normal breath sounds. No wheezing or rales.  Chest:     Chest wall: No tenderness.  Abdominal:     General: Bowel sounds are normal. There is no distension.     Palpations: Abdomen is soft. There is no mass.     Tenderness: There is no abdominal tenderness.  Musculoskeletal:        General: Normal range of motion.     Right lower leg: No edema.     Left lower leg: No edema.  Neurological:     Mental Status: She is alert and oriented to person, place, and time.   Psychiatric:        Mood and Affect: Mood normal.    CMP Latest Ref Rng & Units 06/28/2021 08/31/2019 04/07/2019  Glucose 70 - 99 mg/dL 93 96 99  BUN 6 - 20 mg/dL 12 11 10   Creatinine 0.44 - 1.00 mg/dL 0.81 0.91 0.86  Sodium 135 - 145 mmol/L 139 140 140  Potassium 3.5 - 5.1 mmol/L 3.9 3.8 3.9  Chloride 98 - 111 mmol/L 110 104 101  CO2 22 - 32 mmol/L 23 25 25   Calcium 8.9 - 10.3 mg/dL 8.7(L) 8.8 9.3  Total Protein 6.0 - 8.5 g/dL - - 6.6  Total Bilirubin 0.0 - 1.2 mg/dL - - 0.3  Alkaline Phos 39 - 117 IU/L - - 58  AST 0 - 40 IU/L - - 14  ALT 0 - 32 IU/L - - 10    Lipid Panel     Component Value Date/Time   CHOL 178 04/07/2019 1005   TRIG 82 04/07/2019 1005   HDL 51 04/07/2019 1005   CHOLHDL 3.5 04/07/2019 1005   LDLCALC 111 (H) 04/07/2019 1005    CBC    Component Value Date/Time   WBC 6.4 06/28/2021 1327   RBC 4.50 06/28/2021 1327   HGB 11.4 (L) 06/28/2021 1327   HGB 11.7 04/11/2020 1619   HCT 36.8 06/28/2021 1327   HCT 36.6 04/11/2020 1619   PLT 243 06/28/2021 1327   PLT 251 04/11/2020 1619   MCV 81.8 06/28/2021 1327   MCV 80 04/11/2020 1619   MCH 25.3 (L) 06/28/2021 1327   MCHC 31.0 06/28/2021 1327   RDW 16.3 (H) 06/28/2021 1327   RDW 16.4 (H) 04/11/2020 1619   LYMPHSABS 2.4 04/11/2020 1619   EOSABS 0.1 04/11/2020 1619   BASOSABS 0.1 04/11/2020 1619    Lab Results  Component Value Date   HGBA1C 5.2 12/31/2017    Assessment & Plan:  1. Pedal edema Dependent edema Will restart on low-dose hydrochlorothiazide - hydrochlorothiazide (HYDRODIURIL) 12.5 MG tablet; Take 1 tablet (12.5 mg total) by mouth daily.  Dispense: 90 tablet; Refill: 1  2. Essential hypertension Controlled but given elevated ambulatory systolic blood pressures we will restart low-dose hydrochlorothiazide She has also been advised to change her BP monitor in the event that she could be having erroneous readings - hydrochlorothiazide (HYDRODIURIL) 12.5 MG tablet; Take 1 tablet (12.5 mg  total) by mouth daily.  Dispense: 90 tablet; Refill: 1 - LP+Non-HDL Cholesterol  3. Screening for diabetes mellitus - Hemoglobin A1c  4. Acute conjunctivitis of right eye, unspecified acute conjunctivitis type - trimethoprim-polymyxin b (POLYTRIM) ophthalmic solution; Place 1 drop into the right eye every 6 (six) hours.  Dispense: 10 mL; Refill: 0  5. Need for immunization against influenza - Flu Vaccine QUAD 30mo+IM (  Fluarix, Fluzone & Alfiuria Quad PF)    Meds ordered this encounter  Medications   hydrochlorothiazide (HYDRODIURIL) 12.5 MG tablet    Sig: Take 1 tablet (12.5 mg total) by mouth daily.    Dispense:  90 tablet    Refill:  1   trimethoprim-polymyxin b (POLYTRIM) ophthalmic solution    Sig: Place 1 drop into the right eye every 6 (six) hours.    Dispense:  10 mL    Refill:  0    Follow-up: Return in about 6 weeks (around 11/07/2021) for complete physical exam.       Charlott Rakes, MD, FAAFP. [facility] [place], [place]   09/26/2021, 12:30 PM

## 2021-09-27 LAB — LP+NON-HDL CHOLESTEROL
Cholesterol, Total: 182 mg/dL (ref 100–199)
HDL: 54 mg/dL (ref >39)
LDL Chol Calc (NIH): 119 mg/dL — ABNORMAL HIGH (ref 0–99)
Total Non-HDL-Chol (LDL+VLDL): 128 mg/dL (ref 0–129)
Triglycerides: 48 mg/dL (ref 0–149)
VLDL Cholesterol Cal: 9 mg/dL (ref 5–40)

## 2021-09-27 LAB — HEMOGLOBIN A1C
Est. average glucose Bld gHb Est-mCnc: 128 mg/dL
Hgb A1c MFr Bld: 6.1 % — ABNORMAL HIGH (ref 4.8–5.6)

## 2021-10-08 ENCOUNTER — Telehealth: Payer: Self-pay

## 2021-10-08 NOTE — Telephone Encounter (Signed)
Pt was called and a VM was left informing patient of lab results. 

## 2021-10-08 NOTE — Telephone Encounter (Signed)
-----   Message from Charlott Rakes, MD sent at 09/27/2021  1:36 PM EDT ----- Please inform her that A1c is 6.1 which indicates prediabetes.  Recommendation is to work on weight loss, low carb diet, decrease sweets to prevent progression to diabetes.  Cholesterol is normal.

## 2021-11-11 ENCOUNTER — Encounter: Payer: Self-pay | Admitting: Family Medicine

## 2022-05-07 ENCOUNTER — Emergency Department (HOSPITAL_COMMUNITY)
Admission: EM | Admit: 2022-05-07 | Discharge: 2022-05-08 | Discharge status: Against Medical Advice | Payer: Self-pay | Attending: Emergency Medicine | Admitting: Emergency Medicine

## 2022-05-07 ENCOUNTER — Encounter (HOSPITAL_COMMUNITY): Payer: Self-pay | Admitting: Emergency Medicine

## 2022-05-07 ENCOUNTER — Other Ambulatory Visit: Payer: Self-pay

## 2022-05-07 DIAGNOSIS — Z5321 Procedure and treatment not carried out due to patient leaving prior to being seen by health care provider: Secondary | ICD-10-CM | POA: Insufficient documentation

## 2022-05-07 DIAGNOSIS — R42 Dizziness and giddiness: Secondary | ICD-10-CM | POA: Insufficient documentation

## 2022-05-07 DIAGNOSIS — I10 Essential (primary) hypertension: Secondary | ICD-10-CM | POA: Insufficient documentation

## 2022-05-07 DIAGNOSIS — R110 Nausea: Secondary | ICD-10-CM | POA: Insufficient documentation

## 2022-05-07 NOTE — ED Triage Notes (Signed)
Patient from home with dizziness, states that she has been having hypertension even though she has been taking her BP meds.  Patient with no slurred speech or facial droop.  Patient is able to stand but feels lightheaded.  Patient does have some nausea.  She states that this started around 3pm this afternoon.

## 2022-05-07 NOTE — ED Provider Triage Note (Signed)
  Emergency Medicine Provider Triage Evaluation Note  MRN:  [id number]  Arrival date & time: 05/07/22    Medically screening exam initiated at 11:48 PM.   CC:   Dizziness   HPI:  Natalie Cervantes is a 46 y.o. year-old female presents to the ED with chief complaint of lightheadedness.  States that she began feeling this way around 3 PM this afternoon.  States that her blood pressure has been higher than normal.  She denies having any pain.  Denies numbness, weakness, or tingling.  States that she still feels lightheaded now.  History provided by History provided by patient. ROS:  -As included in HPI PE:   Vitals:   05/07/22 2338  BP: (!) 177/79  Pulse: (!) 53  Resp: 18  Temp: 98.2 F (36.8 C)  SpO2: 100%    Non-toxic appearing No respiratory distress CN 3-12 intact, speech is clear, normal gait MDM I've ordered labs in triage to expedite lab/diagnostic workup.  Patient was informed that the remainder of the evaluation will be completed by another provider, this initial triage assessment does not replace that evaluation, and the importance of remaining in the ED until their evaluation is complete.    Montine Circle, PA-C 05/07/22 2349

## 2022-05-08 ENCOUNTER — Ambulatory Visit: Payer: Self-pay

## 2022-05-08 ENCOUNTER — Emergency Department (HOSPITAL_COMMUNITY)
Admission: EM | Admit: 2022-05-08 | Discharge: 2022-05-09 | Disposition: A | Discharge status: Home / Self Care | Payer: Self-pay | Attending: Emergency Medicine | Admitting: Emergency Medicine

## 2022-05-08 ENCOUNTER — Encounter (HOSPITAL_COMMUNITY): Payer: Self-pay

## 2022-05-08 DIAGNOSIS — Z79899 Other long term (current) drug therapy: Secondary | ICD-10-CM | POA: Insufficient documentation

## 2022-05-08 DIAGNOSIS — I10 Essential (primary) hypertension: Secondary | ICD-10-CM | POA: Insufficient documentation

## 2022-05-08 LAB — CBC
HCT: 33.3 % — ABNORMAL LOW (ref 36.0–46.0)
Hemoglobin: 10.1 g/dL — ABNORMAL LOW (ref 12.0–15.0)
MCH: 22.7 pg — ABNORMAL LOW (ref 26.0–34.0)
MCHC: 30.3 g/dL (ref 30.0–36.0)
MCV: 74.8 fL — ABNORMAL LOW (ref 80.0–100.0)
Platelets: 314 10*3/uL (ref 150–400)
RBC: 4.45 MIL/uL (ref 3.87–5.11)
RDW: 18.2 % — ABNORMAL HIGH (ref 11.5–15.5)
WBC: 8 10*3/uL (ref 4.0–10.5)
nRBC: 0 % (ref 0.0–0.2)

## 2022-05-08 LAB — URINALYSIS, ROUTINE W REFLEX MICROSCOPIC
Bilirubin Urine: NEGATIVE
Glucose, UA: NEGATIVE mg/dL
Ketones, ur: NEGATIVE mg/dL
Leukocytes,Ua: NEGATIVE
Nitrite: NEGATIVE
Protein, ur: NEGATIVE mg/dL
Specific Gravity, Urine: 1.005 (ref 1.005–1.030)
pH: 6 (ref 5.0–8.0)

## 2022-05-08 LAB — COMPREHENSIVE METABOLIC PANEL
ALT: 16 U/L (ref 0–44)
AST: 23 U/L (ref 15–41)
Albumin: 3.5 g/dL (ref 3.5–5.0)
Alkaline Phosphatase: 62 U/L (ref 38–126)
Anion gap: 6 (ref 5–15)
BUN: 5 mg/dL — ABNORMAL LOW (ref 6–20)
CO2: 25 mmol/L (ref 22–32)
Calcium: 8.9 mg/dL (ref 8.9–10.3)
Chloride: 107 mmol/L (ref 98–111)
Creatinine, Ser: 0.87 mg/dL (ref 0.44–1.00)
GFR, Estimated: >60 mL/min (ref >60)
Glucose, Bld: 125 mg/dL — ABNORMAL HIGH (ref 70–99)
Potassium: 3.9 mmol/L (ref 3.5–5.1)
Sodium: 138 mmol/L (ref 135–145)
Total Bilirubin: 0.3 mg/dL (ref 0.3–1.2)
Total Protein: 6.7 g/dL (ref 6.5–8.1)

## 2022-05-08 NOTE — Discharge Instructions (Addendum)
Please take your blood pressure medicine every day.  It needs to be taken every day in order to properly control your blood pressure.  Please check your blood pressure once or twice a day and keep a record of it.  Take that record with you when you see your primary care provider.  If your blood pressure is staying elevated for several days in a row, contact your primary care provider to see if he wants to make any adjustments to your medication regimen.

## 2022-05-08 NOTE — ED Provider Notes (Signed)
[facility] DEPT Provider Note   CSN: [id number] Arrival date & time: 05/08/22  2258     History  Chief Complaint  Patient presents with   Hypertension    Natalie Cervantes is a 46 y.o. female.  The history is provided by the patient.  Hypertension She has history of hypertension and comes in because of elevated blood pressure at home.  Blood pressure was as high as 188/110.  She denies headache, tinnitus, epistaxis, visual change.  She did not take her blood pressure medicine today as she only takes it when she feels funny.  She also only checks her blood pressure she feels it might be elevated.  Blood pressure was elevated yesterday, but was normal 3 days ago.   Home Medications Prior to Admission medications   Medication Sig Start Date End Date Taking? Authorizing Provider  cyclobenzaprine (FLEXERIL) 10 MG tablet Take 1 tablet (10 mg total) by mouth at bedtime. Patient not taking: No sig reported 08/31/19   Charlott Rakes, MD  Ferrous Sulfate (IRON) 325 (65 Fe) MG TABS Take 1 tablet by mouth twice daily 05/18/20   Charlott Rakes, MD  hydrochlorothiazide (HYDRODIURIL) 12.5 MG tablet Take 1 tablet (12.5 mg total) by mouth daily. 09/26/21   Charlott Rakes, MD  ibuprofen (ADVIL) 800 MG tablet Take 1 tablet (800 mg total) by mouth every 8 (eight) hours as needed. 07/02/21   Princess Bruins, MD  trimethoprim-polymyxin b (POLYTRIM) ophthalmic solution Place 1 drop into the right eye every 6 (six) hours. 09/26/21   Charlott Rakes, MD      Allergies    Patient has no known allergies.    Review of Systems   Review of Systems  All other systems reviewed and are negative.  Physical Exam Updated Vital Signs BP (!) 192/84 (BP Location: Right Arm)   Pulse (!) 56   Temp 98.3 F (36.8 C) (Oral)   Resp 18   Ht '5\' 3"'$  (1.6 m)   Wt 108.9 kg   SpO2 100%   BMI 42.51 kg/m  Physical Exam Vitals and nursing note reviewed.  46 year old female, resting  comfortably and in no acute distress. Vital signs are significant for elevated blood pressure and borderline slow heart rate. Oxygen saturation is 100%, which is normal. Head is normocephalic and atraumatic. PERRLA, EOMI. Oropharynx is clear.  Fundi show no hemorrhage, exudate, papilledema. Neck is nontender and supple without adenopathy or JVD. Back is nontender and there is no CVA tenderness. Lungs are clear without rales, wheezes, or rhonchi. Chest is nontender. Heart has regular rate and rhythm without murmur. Abdomen is soft, flat, nontender. Extremities have no cyanosis or edema, full range of motion is present. Skin is warm and dry without rash. Neurologic: Mental status is normal, cranial nerves are intact, moves all extremities equally.  ED Results / Procedures / Treatments    Procedures Procedures    Medications Ordered in ED Medications - No data to display  ED Course/ Medical Decision Making/ A&P                           Medical Decision Making  Elevated blood pressure in patient with history of hypertension.  She clearly has not been taking her antihypertensive medication the way she is supposed to.  Need for taking medication daily was stressed.  I have also stressed the importance of checking her blood pressure daily, not just when she feels bad.  Importance  of a low-salt diet was stressed.  At this point, with no evidence of any endorgan damage, no additional work-up is needed.  Old records are reviewed, and she was in the ED yesterday with elevated blood pressure as high as 212/86.  Prior blood pressure readings had been normal, including at an office visit on 09/26/2021 and an office visit on 07/16/2021.  Final Clinical Impression(s) / ED Diagnoses Final diagnoses:  Elevated blood pressure reading with diagnosis of hypertension    Rx / DC Orders ED Discharge Orders     None         Delora Fuel, MD 12/11/02 2358

## 2022-05-08 NOTE — Telephone Encounter (Signed)
    Chief Complaint: BP in ED 05/07/22 177/79.Left without disposition. BP today 169/96. Denies any symptoms today. "I feel better now."  Symptoms: None today Frequency: Yesterday. Pertinent Negatives: Patient denies symptoms Disposition: '[]'$ ED /'[]'$ Urgent Care (no appt availability in office) / '[]'$ Appointment(In office/virtual)/ '[]'$  [facility]/ '[]'$ Home Care/ '[]'$ Refused Recommended Disposition /'[]'$ [facility]/ '[x]'$  Follow-up with PCP Additional Notes: No availability in practice. Pt. Asking to be worked in. Please advise.  Answer Assessment - Initial Assessment Questions 1. BLOOD PRESSURE: "What is the blood pressure?" "Did you take at least two measurements 5 minutes apart?"     05/07/22 177/79  TODAY - 169/96  pulse 71 2. ONSET: "When did you take your blood pressure?"     Yesterday 3. HOW: "How did you obtain the blood pressure?" (e.g., visiting nurse, automatic home BP monitor)     Home cuff 4. HISTORY: "Do you have a history of high blood pressure?"     Yes 5. MEDICATIONS: "Are you taking any medications for blood pressure?" "Have you missed any doses recently?"     Yes, no missed doses 6. OTHER SYMPTOMS: "Do you have any symptoms?" (e.g., headache, chest pain, blurred vision, difficulty breathing, weakness)     "I feel better than I did in the ED."  7. PREGNANCY: "Is there any chance you are pregnant?" "When was your last menstrual period?"     No  Protocols used: Blood Pressure - High-A-AH

## 2022-05-08 NOTE — ED Notes (Signed)
Pt left AMA °

## 2022-05-08 NOTE — ED Triage Notes (Signed)
Pt presents to ED with c/o hypertension. States she has not taken her blood pressure medication today, takes HCTZ 12.5 mg. Denies N/V, dizziness, headache, or SOB.

## 2022-05-12 NOTE — Telephone Encounter (Signed)
Patient was called and given a appointment with PCP

## 2022-05-26 ENCOUNTER — Ambulatory Visit: Payer: Self-pay | Attending: Family Medicine | Admitting: Family Medicine

## 2022-05-26 ENCOUNTER — Encounter: Payer: Self-pay | Admitting: Family Medicine

## 2022-05-26 VITALS — BP 145/84 | HR 51 | Temp 98.2°F | Ht 63.0 in | Wt 242.4 lb

## 2022-05-26 DIAGNOSIS — M25562 Pain in left knee: Secondary | ICD-10-CM

## 2022-05-26 DIAGNOSIS — Z1211 Encounter for screening for malignant neoplasm of colon: Secondary | ICD-10-CM

## 2022-05-26 DIAGNOSIS — I10 Essential (primary) hypertension: Secondary | ICD-10-CM

## 2022-05-26 DIAGNOSIS — G8929 Other chronic pain: Secondary | ICD-10-CM

## 2022-05-26 DIAGNOSIS — M25561 Pain in right knee: Secondary | ICD-10-CM

## 2022-05-26 MED ORDER — IBUPROFEN 800 MG PO TABS: 800.0000 mg | ORAL_TABLET | Freq: Two times a day (BID) | ORAL | 1 refills | Status: DC | PRN

## 2022-05-26 MED ORDER — CHLORTHALIDONE 25 MG PO TABS: 25.0000 mg | ORAL_TABLET | Freq: Every day | ORAL | 1 refills | Status: DC

## 2022-05-26 NOTE — Progress Notes (Signed)
Subjective:  Patient ID: Natalie Cervantes, female    DOB: 1976-05-31  Age: 46 y.o. MRN: [id number]  CC: Hypertension   HPI Elayna Tobler is a 46 y.o. year old female with a history of hypertension, obesity, anemia secondary to menorrhagia (status post D&C in 06/2021) who presents today for follow-up visit.  Interval History: At home she complains her systolic BP has been elevated to the 195.She states she has not been taking her antihypertensive daily but has been taking hydrochlorothiazide 12.5 mg only when she checks her blood pressure and it is elevated but at other times she does not take it when her blood pressure is normal.  She states she was unaware that this was a daily medication.  She is requesting refill of ibuprofen to be used as needed for chronic knee pains. Past Medical History:  Diagnosis Date   Anemia    Fibroid    History of hypertension 06/28/2021   no bp meds needed x 1 pear per pt   No pertinent past medical history     Past Surgical History:  Procedure Laterality Date   DILATION AND CURETTAGE OF UTERUS N/A 07/02/2021   Procedure: DILATATION AND CURETTAGE;  Surgeon: Princess Bruins, MD;  Location: [facility];  Service: Gynecology;  Laterality: N/A;   NO PAST SURGERIES      No family history on file.  Social History   Socioeconomic History   Marital status: Single    Spouse name: Not on file   Number of children: Not on file   Years of education: Not on file   Highest education level: Not on file  Occupational History   Not on file  Tobacco Use   Smoking status: Never   Smokeless tobacco: Never  Vaping Use   Vaping Use: Never used  Substance and Sexual Activity   Alcohol use: No   Drug use: No   Sexual activity: Not Currently    Partners: Male    Birth control/protection: None  Other Topics Concern   Not on file  Social History Narrative   Not on file   Social Determinants of Health   Financial Resource Strain: Not on file   Food Insecurity: Not on file  Transportation Needs: Not on file  Physical Activity: Not on file  Stress: Not on file  Social Connections: Not on file    No Known Allergies  Outpatient Medications Prior to Visit  Medication Sig Dispense Refill   Ferrous Sulfate (IRON) 325 (65 Fe) MG TABS Take 1 tablet by mouth twice daily 180 tablet 0   hydrochlorothiazide (HYDRODIURIL) 12.5 MG tablet Take 1 tablet (12.5 mg total) by mouth daily. 90 tablet 1   ibuprofen (ADVIL) 800 MG tablet Take 1 tablet (800 mg total) by mouth every 8 (eight) hours as needed. 20 tablet 0   No facility-administered medications prior to visit.     ROS Review of Systems  Constitutional:  Negative for activity change, appetite change and fatigue.  HENT:  Negative for congestion, sinus pressure and sore throat.   Eyes:  Negative for visual disturbance.  Respiratory:  Negative for cough, chest tightness, shortness of breath and wheezing.   Cardiovascular:  Negative for chest pain and palpitations.  Gastrointestinal:  Negative for abdominal distention, abdominal pain and constipation.  Endocrine: Negative for polydipsia.  Genitourinary:  Negative for dysuria and frequency.  Musculoskeletal:  Negative for arthralgias and back pain.  Skin:  Negative for rash.  Neurological:  Negative for tremors, light-headedness  and numbness.  Hematological:  Does not bruise/bleed easily.  Psychiatric/Behavioral:  Negative for agitation and behavioral problems.     Objective:  BP (!) 145/84   Pulse (!) 51   Temp 98.2 F (36.8 C) (Oral)   Ht '5\' 3"'$  (1.6 m)   Wt 242 lb 6.4 oz (110 kg)   LMP 05/19/2022   SpO2 98%   BMI 42.94 kg/m      05/26/2022    9:00 AM 05/08/2022   11:25 PM 05/08/2022   11:05 PM  BP/Weight  Systolic BP 741  638  Diastolic BP 84  84  Wt. (Lbs) 242.4 240   BMI 42.94 kg/m2 42.51 kg/m2       Physical Exam Constitutional:      Appearance: She is well-developed.  Cardiovascular:     Rate and Rhythm:  Bradycardia present.     Heart sounds: Normal heart sounds. No murmur heard. Pulmonary:     Effort: Pulmonary effort is normal.     Breath sounds: Normal breath sounds. No wheezing or rales.  Chest:     Chest wall: No tenderness.  Abdominal:     General: Bowel sounds are normal. There is no distension.     Palpations: Abdomen is soft. There is no mass.     Tenderness: There is no abdominal tenderness.  Musculoskeletal:        General: Normal range of motion.     Right lower leg: No edema.     Left lower leg: No edema.  Neurological:     Mental Status: She is alert and oriented to person, place, and time.  Psychiatric:        Mood and Affect: Mood normal.        Latest Ref Rng & Units 05/07/2022   11:54 PM 06/28/2021    1:27 PM 08/31/2019    4:48 PM  CMP  Glucose 70 - 99 mg/dL 125  93  96   BUN 6 - 20 mg/dL '5  12  11   '$ Creatinine 0.44 - 1.00 mg/dL 0.87  0.81  0.91   Sodium 135 - 145 mmol/L 138  139  140   Potassium 3.5 - 5.1 mmol/L 3.9  3.9  3.8   Chloride 98 - 111 mmol/L 107  110  104   CO2 22 - 32 mmol/L '25  23  25   '$ Calcium 8.9 - 10.3 mg/dL 8.9  8.7  8.8   Total Protein 6.5 - 8.1 g/dL 6.7     Total Bilirubin 0.3 - 1.2 mg/dL 0.3     Alkaline Phos 38 - 126 U/L 62     AST 15 - 41 U/L 23     ALT 0 - 44 U/L 16       Lipid Panel     Component Value Date/Time   CHOL 182 09/26/2021 1044   TRIG 48 09/26/2021 1044   HDL 54 09/26/2021 1044   CHOLHDL 3.5 04/07/2019 1005   LDLCALC 119 (H) 09/26/2021 1044    CBC    Component Value Date/Time   WBC 8.0 05/07/2022 2354   RBC 4.45 05/07/2022 2354   HGB 10.1 (L) 05/07/2022 2354   HGB 11.7 04/11/2020 1619   HCT 33.3 (L) 05/07/2022 2354   HCT 36.6 04/11/2020 1619   PLT 314 05/07/2022 2354   PLT 251 04/11/2020 1619   MCV 74.8 (L) 05/07/2022 2354   MCV 80 04/11/2020 1619   MCH 22.7 (L) 05/07/2022 2354   MCHC 30.3 05/07/2022  2354   RDW 18.2 (H) 05/07/2022 2354   RDW 16.4 (H) 04/11/2020 1619   LYMPHSABS 2.4 04/11/2020 1619    EOSABS 0.1 04/11/2020 1619   BASOSABS 0.1 04/11/2020 1619    Lab Results  Component Value Date   HGBA1C 6.1 (H) 09/26/2021    Assessment & Plan:  1. Essential hypertension Uncontrolled We will switch from HCTZ to chlorthalidone Advised that antihypertensive should be administered daily rather than as needed She will continue to keep a home blood pressure log Counseled on blood pressure goal of less than 130/80, low-sodium, DASH diet, medication compliance, 150 minutes of moderate intensity exercise per week. Discussed medication compliance, adverse effects. - chlorthalidone (HYGROTON) 25 MG tablet; Take 1 tablet (25 mg total) by mouth daily.  Dispense: 90 tablet; Refill: 1  2. Chronic pain of both knees Intermittent Advised that weight loss will be beneficial Refill of ibuprofen - ibuprofen (ADVIL) 800 MG tablet; Take 1 tablet (800 mg total) by mouth every 12 (twelve) hours as needed.  Dispense: 60 tablet; Refill: 1  3. Screening for colon cancer - Fecal occult blood, imunochemical    Meds ordered this encounter  Medications   chlorthalidone (HYGROTON) 25 MG tablet    Sig: Take 1 tablet (25 mg total) by mouth daily.    Dispense:  90 tablet    Refill:  1    Discontinue HCTZ   ibuprofen (ADVIL) 800 MG tablet    Sig: Take 1 tablet (800 mg total) by mouth every 12 (twelve) hours as needed.    Dispense:  60 tablet    Refill:  1    Follow-up: Return in about 3 months (around 08/26/2022) for Chronic medical conditions.       Charlott Rakes, MD, FAAFP. [facility] [place], [place]   05/26/2022, 10:27 AM

## 2022-05-26 NOTE — Patient Instructions (Signed)
Managing Your Hypertension Hypertension, also called high blood pressure, is when the force of the blood pressing against the walls of the arteries is too strong. Arteries are blood vessels that carry blood from your heart throughout your body. Hypertension forces the heart to work harder to pump blood and may cause the arteries to become narrow or stiff. Understanding blood pressure readings A blood pressure reading includes a higher number over a lower number: The first, or top, number is called the systolic pressure. It is a measure of the pressure in your arteries as your heart beats. The second, or bottom number, is called the diastolic pressure. It is a measure of the pressure in your arteries as the heart relaxes. For most people, a normal blood pressure is below 120/80. Your personal target blood pressure may vary depending on your medical conditions, your age, and other factors. Blood pressure is classified into four stages. Based on your blood pressure reading, your health care provider may use the following stages to determine what type of treatment you need, if any. Systolic pressure and diastolic pressure are measured in a unit called millimeters of mercury (mmHg). Normal Systolic pressure: below 120. Diastolic pressure: below 80. Elevated Systolic pressure: 120-129. Diastolic pressure: below 80. Hypertension stage 1 Systolic pressure: 130-139. Diastolic pressure: 80-89. Hypertension stage 2 Systolic pressure: 140 or above. Diastolic pressure: 90 or above. How can this condition affect me? Managing your hypertension is very important. Over time, hypertension can damage the arteries and decrease blood flow to parts of the body, including the brain, heart, and kidneys. Having untreated or uncontrolled hypertension can lead to: A heart attack. A stroke. A weakened blood vessel (aneurysm). Heart failure. Kidney damage. Eye damage. Memory and concentration problems. Vascular  dementia. What actions can I take to manage this condition? Hypertension can be managed by making lifestyle changes and possibly by taking medicines. Your health care provider will help you make a plan to bring your blood pressure within a normal range. You may be referred for counseling on a healthy diet and physical activity. Nutrition  Eat a diet that is high in fiber and potassium, and low in salt (sodium), added sugar, and fat. An example eating plan is called the DASH diet. DASH stands for Dietary Approaches to Stop Hypertension. To eat this way: Eat plenty of fresh fruits and vegetables. Try to fill one-half of your plate at each meal with fruits and vegetables. Eat whole grains, such as whole-wheat pasta, brown rice, or whole-grain bread. Fill about one-fourth of your plate with whole grains. Eat low-fat dairy products. Avoid fatty cuts of meat, processed or cured meats, and poultry with skin. Fill about one-fourth of your plate with lean proteins such as fish, chicken without skin, beans, eggs, and tofu. Avoid pre-made and processed foods. These tend to be higher in sodium, added sugar, and fat. Reduce your daily sodium intake. Many people with hypertension should eat less than 1,500 mg of sodium a day. Lifestyle  Work with your health care provider to maintain a healthy body weight or to lose weight. Ask what an ideal weight is for you. Get at least 30 minutes of exercise that causes your heart to beat faster (aerobic exercise) most days of the week. Activities may include walking, swimming, or biking. Include exercise to strengthen your muscles (resistance exercise), such as weight lifting, as part of your weekly exercise routine. Try to do these types of exercises for 30 minutes at least 3 days a week. Do   not use any products that contain nicotine or tobacco. These products include cigarettes, chewing tobacco, and vaping devices, such as e-cigarettes. If you need help quitting, ask your  health care provider. Control any long-term (chronic) conditions you have, such as high cholesterol or diabetes. Identify your sources of stress and find ways to manage stress. This may include meditation, deep breathing, or making time for fun activities. Alcohol use Do not drink alcohol if: Your health care provider tells you not to drink. You are pregnant, may be pregnant, or are planning to become pregnant. If you drink alcohol: Limit how much you have to: 0-1 drink a day for women. 0-2 drinks a day for men. Know how much alcohol is in your drink. In the [place] one drink equals one 12 oz bottle of beer (355 mL), one 5 oz glass of wine (148 mL), or one 1 oz glass of hard liquor (44 mL). Medicines Your health care provider may prescribe medicine if lifestyle changes are not enough to get your blood pressure under control and if: Your systolic blood pressure is 130 or higher. Your diastolic blood pressure is 80 or higher. Take medicines only as told by your health care provider. Follow the directions carefully. Blood pressure medicines must be taken as told by your health care provider. The medicine does not work as well when you skip doses. Skipping doses also puts you at risk for problems. Monitoring Before you monitor your blood pressure: Do not smoke, drink caffeinated beverages, or exercise within 30 minutes before taking a measurement. Use the bathroom and empty your bladder (urinate). Sit quietly for at least 5 minutes before taking measurements. Monitor your blood pressure at home as told by your health care provider. To do this: Sit with your back straight and supported. Place your feet flat on the floor. Do not cross your legs. Support your arm on a flat surface, such as a table. Make sure your upper arm is at heart level. Each time you measure, take two or three readings one minute apart and record the results. You may also need to have your blood pressure checked regularly by  your health care provider. General information Talk with your health care provider about your diet, exercise habits, and other lifestyle factors that may be contributing to hypertension. Review all the medicines you take with your health care provider because there may be side effects or interactions. Keep all follow-up visits. Your health care provider can help you create and adjust your plan for managing your high blood pressure. Where to find more information National Heart, Lung, and Blood Institute: www.nhlbi.nih.gov American Heart Association: www.heart.org Contact a health care provider if: You think you are having a reaction to medicines you have taken. You have repeated (recurrent) headaches. You feel dizzy. You have swelling in your ankles. You have trouble with your vision. Get help right away if: You develop a severe headache or confusion. You have unusual weakness or numbness, or you feel faint. You have severe pain in your chest or abdomen. You vomit repeatedly. You have trouble breathing. These symptoms may be an emergency. Get help right away. Call 911. Do not wait to see if the symptoms will go away. Do not drive yourself to the hospital. Summary Hypertension is when the force of blood pumping through your arteries is too strong. If this condition is not controlled, it may put you at risk for serious complications. Your personal target blood pressure may vary depending on your medical conditions,   your age, and other factors. For most people, a normal blood pressure is less than 120/80. Hypertension is managed by lifestyle changes, medicines, or both. Lifestyle changes to help manage hypertension include losing weight, eating a healthy, low-sodium diet, exercising more, stopping smoking, and limiting alcohol. This information is not intended to replace advice given to you by your health care provider. Make sure you discuss any questions you have with your health care  provider. Document Revised: 08/08/2021 Document Reviewed: 08/08/2021 Elsevier Patient Education  2023 [employer]  

## 2023-02-24 ENCOUNTER — Other Ambulatory Visit: Payer: Self-pay

## 2023-02-24 ENCOUNTER — Other Ambulatory Visit: Payer: Self-pay | Admitting: Family Medicine

## 2023-02-24 DIAGNOSIS — G8929 Other chronic pain: Secondary | ICD-10-CM

## 2023-02-24 DIAGNOSIS — I10 Essential (primary) hypertension: Secondary | ICD-10-CM

## 2023-02-24 NOTE — Telephone Encounter (Signed)
Medication Refill - Medication: ibuprofen (ADVIL) 800 MG tablet [id number]  chlorthalidone (HYGROTON) 25 MG tablet [id number]    Has the patient contacted their pharmacy? Yes.   (Agent: If no, request that the patient contact the pharmacy for the refill. If patient does not wish to contact the pharmacy document the reason why and proceed with request.) (Agent: If yes, when and what did the pharmacy advise?) request sent to office   Preferred Pharmacy (with phone number or street name): [facility]  Has the patient been seen for an appointment in the last year OR does the patient have an upcoming appointment? No. June 2023   Agent: Please be advised that RX refills may take up to 3 business days. We ask that you follow-up with your pharmacy.

## 2023-02-25 ENCOUNTER — Other Ambulatory Visit: Payer: Self-pay

## 2023-02-25 MED ORDER — IBUPROFEN 800 MG PO TABS
800.0000 mg | ORAL_TABLET | Freq: Two times a day (BID) | ORAL | 0 refills | Status: DC | PRN
  Filled 2023-02-25: qty 60, 30d supply, fill #0

## 2023-02-25 MED ORDER — CHLORTHALIDONE 25 MG PO TABS
25.0000 mg | ORAL_TABLET | Freq: Every day | ORAL | 0 refills | Status: DC
  Filled 2023-02-25: qty 30, 30d supply, fill #0

## 2023-02-26 ENCOUNTER — Other Ambulatory Visit: Payer: Self-pay

## 2023-07-02 ENCOUNTER — Other Ambulatory Visit: Payer: Self-pay | Admitting: Family Medicine

## 2023-07-02 DIAGNOSIS — G8929 Other chronic pain: Secondary | ICD-10-CM

## 2023-07-02 DIAGNOSIS — I10 Essential (primary) hypertension: Secondary | ICD-10-CM

## 2023-07-02 NOTE — Telephone Encounter (Signed)
Medication Refill - Medication:   -- naproxen 800 MG for pain -- Blood pressure medication (patient has only took one BP medication and does not know the name) almost out of medication & needs a refill up until appt date 09/24/2023   Has the patient contacted their pharmacy? Yes.  Advised to contact PCP   Preferred Pharmacy (with phone number or street name): [facility]  Phone: [phone] Fax: [phone]  Has the patient been seen for an appointment in the last year OR does the patient have an upcoming appointment? Yes.  F/U scheduled for next available on 09/24/23

## 2023-07-02 NOTE — Telephone Encounter (Signed)
Requested medication (s) are due for refill today: yes  Requested medication (s) are on the active medication list: yes  Last refill:  02/25/23  Future visit scheduled: yes  Notes to clinic:  Unable to refill per protocol due to failed labs, no updated results.      Requested Prescriptions  Pending Prescriptions Disp Refills   chlorthalidone (HYGROTON) 25 MG tablet 30 tablet 0    Sig: Take 1 tablet (25 mg total) by mouth daily. Please make PCP appt for more refills.     Cardiovascular: Diuretics - Thiazide Failed - 07/02/2023  2:12 PM      Failed - Cr in normal range and within 180 days    Creatinine, Ser  Date Value Ref Range Status  05/07/2022 0.87 0.44 - 1.00 mg/dL Final         Failed - K in normal range and within 180 days    Potassium  Date Value Ref Range Status  05/07/2022 3.9 3.5 - 5.1 mmol/L Final         Failed - Na in normal range and within 180 days    Sodium  Date Value Ref Range Status  05/07/2022 138 135 - 145 mmol/L Final  08/31/2019 140 134 - 144 mmol/L Final         Failed - Last BP in normal range    BP Readings from Last 1 Encounters:  05/26/22 (!) 145/84         Failed - Valid encounter within last 6 months    Recent Outpatient Visits           1 year ago Essential hypertension   [place] [facility] [place], Odette Horns, MD   1 year ago Pedal edema   [place] [facility] Hoy Register, MD   3 years ago Irregular menses   [place] [facility] [place], [place], [place]   3 years ago Need for influenza vaccination   [facility] [place], [place] L, RPH-CPP   3 years ago Class 3 severe obesity due to excess calories without serious comorbidity with body mass index (BMI) of 40.0 to 44.9 in adult [facility])   [place] [facility] [place], Odette Horns, MD       Future Appointments             In 2 months Hoy Register, MD [facility]             ibuprofen (ADVIL) 800 MG tablet 60 tablet 0    Sig: Take 1 tablet (800 mg total) by mouth every 12 (twelve) hours as needed. Please make PCP appt for more refills.     Analgesics:  NSAIDS Failed - 07/02/2023  2:12 PM      Failed - Manual Review: Labs are only required if the patient has taken medication for more than 8 weeks.      Failed - Cr in normal range and within 360 days    Creatinine, Ser  Date Value Ref Range Status  05/07/2022 0.87 0.44 - 1.00 mg/dL Final         Failed - HGB in normal range and within 360 days    Hemoglobin  Date Value Ref Range Status  05/07/2022 10.1 (L) 12.0 - 15.0 g/dL Final  16/09/9603 54.0 11.1 - 15.9 g/dL Final         Failed -  PLT in normal range and within 360 days    Platelets  Date Value Ref Range Status  05/07/2022 314 150 - 400 K/uL Final  04/11/2020 251 150 - 450 x10E3/uL Final         Failed - HCT in normal range and within 360 days    HCT  Date Value Ref Range Status  05/07/2022 33.3 (L) 36.0 - 46.0 % Final   Hematocrit  Date Value Ref Range Status  04/11/2020 36.6 34.0 - 46.6 % Final         Failed - eGFR is 30 or above and within 360 days    GFR calc Af Amer  Date Value Ref Range Status  08/31/2019 88 >59 mL/min/1.73 Final   GFR, Estimated  Date Value Ref Range Status  05/07/2022 >60 >60 mL/min Final    Comment:    (NOTE) Calculated using the CKD-EPI Creatinine Equation (2021)          Failed - Valid encounter within last 12 months    Recent Outpatient Visits           1 year ago Essential hypertension   [place] [facility] Hoy Register, MD   1 year ago Pedal edema   [place] [facility] Hoy Register, MD   3 years ago Irregular menses   [place] [facility] [place], [place], [place]   3 years ago Need for influenza vaccination   [facility] [place], [place] L, RPH-CPP   3 years ago Class 3 severe obesity due to excess calories without serious comorbidity with body mass index (BMI) of 40.0 to 44.9 in adult [facility])   [place] [facility] Hoy Register, MD       Future Appointments             In 2 months Hoy Register, MD [facility]            Passed - Patient is not pregnant

## 2023-07-03 ENCOUNTER — Other Ambulatory Visit: Payer: Self-pay

## 2023-07-03 MED ORDER — IBUPROFEN 800 MG PO TABS
800.0000 mg | ORAL_TABLET | Freq: Two times a day (BID) | ORAL | 0 refills | Status: DC | PRN
  Filled 2023-07-03: qty 60, 30d supply, fill #0

## 2023-07-03 MED ORDER — CHLORTHALIDONE 25 MG PO TABS
25.0000 mg | ORAL_TABLET | Freq: Every day | ORAL | 0 refills | Status: DC
  Filled 2023-07-03: qty 30, 30d supply, fill #0

## 2023-07-07 ENCOUNTER — Other Ambulatory Visit: Payer: Self-pay

## 2023-09-24 ENCOUNTER — Ambulatory Visit: Payer: Self-pay | Admitting: Family Medicine

## 2023-12-14 ENCOUNTER — Encounter: Payer: Self-pay | Admitting: Family Medicine

## 2024-01-12 ENCOUNTER — Encounter: Payer: Self-pay | Admitting: Family Medicine

## 2024-06-07 ENCOUNTER — Telehealth: Payer: Self-pay | Admitting: Family Medicine

## 2024-06-07 NOTE — Telephone Encounter (Signed)
 Contacted pt left vm to confirmed appt

## 2024-06-08 ENCOUNTER — Ambulatory Visit: Payer: Self-pay | Attending: Family Medicine | Admitting: Family Medicine

## 2024-06-08 ENCOUNTER — Encounter: Payer: Self-pay | Admitting: Family Medicine

## 2024-06-08 VITALS — BP 180/83 | HR 54 | Ht 63.0 in | Wt 240.2 lb

## 2024-06-08 DIAGNOSIS — Z6841 Body Mass Index (BMI) 40.0 and over, adult: Secondary | ICD-10-CM

## 2024-06-08 DIAGNOSIS — I10 Essential (primary) hypertension: Secondary | ICD-10-CM

## 2024-06-08 DIAGNOSIS — M25561 Pain in right knee: Secondary | ICD-10-CM

## 2024-06-08 DIAGNOSIS — R202 Paresthesia of skin: Secondary | ICD-10-CM

## 2024-06-08 DIAGNOSIS — E66813 Obesity, class 3: Secondary | ICD-10-CM

## 2024-06-08 DIAGNOSIS — R7303 Prediabetes: Secondary | ICD-10-CM

## 2024-06-08 DIAGNOSIS — G8929 Other chronic pain: Secondary | ICD-10-CM

## 2024-06-08 DIAGNOSIS — M25562 Pain in left knee: Secondary | ICD-10-CM

## 2024-06-08 LAB — POCT GLYCOSYLATED HEMOGLOBIN (HGB A1C): HbA1c, POC (prediabetic range): 5.9 % (ref 5.7–6.4)

## 2024-06-08 MED ORDER — MELOXICAM 7.5 MG PO TABS: 7.5000 mg | ORAL_TABLET | Freq: Every day | ORAL | 3 refills | Status: AC

## 2024-06-08 MED ORDER — GABAPENTIN 300 MG PO CAPS: 300.0000 mg | ORAL_CAPSULE | Freq: Every day | ORAL | 3 refills | Status: AC

## 2024-06-08 MED ORDER — CHLORTHALIDONE 25 MG PO TABS: 25.0000 mg | ORAL_TABLET | Freq: Every day | ORAL | 1 refills | Status: AC

## 2024-06-08 NOTE — Progress Notes (Signed)
 Subjective:  Patient ID: Natalie Cervantes, female    DOB: 1976-04-20  Age: 48 y.o. MRN: [id number]  CC: Medical Management of Chronic Issues (Numbness in fingers/Bilateral knee pain)     Discussed the use of AI scribe software for clinical note transcription with the patient, who gave verbal consent to proceed.  History of Present Illness Natalie Cervantes is a 48 year old female with a history of hypertension, obesity, anemia secondary to menorrhagia (status post D&C in 06/2021) who presents with knee pain and finger numbness.  She experiences persistent knee pain, rated 9 out of 10, primarily in the anterior knee. Ibuprofen  has not provided relief. No imaging studies have been conducted. Previous foot swelling has improved.  She experiences constant numbness and tingling in the fingertips, requiring hand shaking for relief.  Symptoms are worse when she is bringing her kids here.  She is right-handed with no wrist pain. Her A1c is 5.9, decreased from 6.1, and she has not been diagnosed with diabetes.  She is not taking blood pressure medication, having stopped chlorthalidone . She finds weight loss challenging with age and is attempting to lose weight to alleviate knee pain.    Past Medical History:  Diagnosis Date   Anemia    Fibroid    History of hypertension 06/28/2021   no bp meds needed x 1 pear per pt   No pertinent past medical history     Past Surgical History:  Procedure Laterality Date   DILATION AND CURETTAGE OF UTERUS N/A 07/02/2021   Procedure: DILATATION AND CURETTAGE;  Surgeon: Lavoie, Marie-Lyne, MD;  Location: [facility] [place];  Service: Gynecology;  Laterality: N/A;   NO PAST SURGERIES      No family history on file.  Social History   Socioeconomic History   Marital status: Single    Spouse name: Not on file   Number of children: Not on file   Years of education: Not on file   Highest education level: Not on file  Occupational History   Not on file   Tobacco Use   Smoking status: Never   Smokeless tobacco: Never  Vaping Use   Vaping status: Never Used  Substance and Sexual Activity   Alcohol use: No   Drug use: No   Sexual activity: Not Currently    Partners: Male    Birth control/protection: None  Other Topics Concern   Not on file  Social History Narrative   Not on file   Social Drivers of Health   Financial Resource Strain: Not on file  Food Insecurity: Not on file  Transportation Needs: Not on file  Physical Activity: Not on file  Stress: Not on file  Social Connections: Not on file    No Known Allergies  Outpatient Medications Prior to Visit  Medication Sig Dispense Refill   chlorthalidone  (HYGROTON ) 25 MG tablet Take 1 tablet (25 mg total) by mouth daily. Please make PCP appt for more refills. 30 tablet 0   Ferrous Sulfate  (IRON ) 325 (65 Fe) MG TABS Take 1 tablet by mouth twice daily (Patient not taking: Reported on 06/08/2024) 180 tablet 0   ibuprofen  (ADVIL ) 800 MG tablet Take 1 tablet (800 mg total) by mouth every 12 (twelve) hours as needed. Please make PCP appt for more refills. (Patient not taking: Reported on 06/08/2024) 60 tablet 0   No facility-administered medications prior to visit.     ROS Review of Systems  Constitutional:  Negative for activity change and appetite change.  HENT:  Negative for sinus pressure and sore throat.   Respiratory:  Negative for chest tightness, shortness of breath and wheezing.   Cardiovascular:  Negative for chest pain and palpitations.  Gastrointestinal:  Negative for abdominal distention, abdominal pain and constipation.  Genitourinary: Negative.   Musculoskeletal: Negative.   Psychiatric/Behavioral:  Negative for behavioral problems and dysphoric mood.     Objective:  BP (!) 180/83   Pulse (!) 54   Ht 5' 3 (1.6 m)   Wt 240 lb 3.2 oz (109 kg)   SpO2 98%   BMI 42.55 kg/m      06/08/2024    4:30 PM 06/08/2024    3:58 PM 05/26/2022    9:00 AM  BP/Weight   Systolic BP 180 167 145  Diastolic BP 83 89 84  Wt. (Lbs)  240.2 242.4  BMI  42.55 kg/m2 42.94 kg/m2      Physical Exam Constitutional:      Appearance: She is well-developed.  Cardiovascular:     Rate and Rhythm: Normal rate.     Heart sounds: Normal heart sounds. No murmur heard. Pulmonary:     Effort: Pulmonary effort is normal.     Breath sounds: Normal breath sounds. No wheezing or rales.  Chest:     Chest wall: No tenderness.  Abdominal:     General: Bowel sounds are normal. There is no distension.     Palpations: Abdomen is soft. There is no mass.     Tenderness: There is no abdominal tenderness.  Musculoskeletal:        General: Normal range of motion.     Right lower leg: No edema.     Left lower leg: No edema.     Comments: Normal range of motion of both knees with slight tenderness Bilateral wrist are normal with negative Tinel and Phalen sign  Neurological:     Mental Status: She is alert and oriented to person, place, and time.  Psychiatric:        Mood and Affect: Mood normal.        Latest Ref Rng & Units 05/07/2022   11:54 PM 06/28/2021    1:27 PM 08/31/2019    4:48 PM  CMP  Glucose 70 - 99 mg/dL 874  93  96   BUN 6 - 20 mg/dL 5  12  11    Creatinine 0.44 - 1.00 mg/dL 9.12  9.18  9.08   Sodium 135 - 145 mmol/L 138  139  140   Potassium 3.5 - 5.1 mmol/L 3.9  3.9  3.8   Chloride 98 - 111 mmol/L 107  110  104   CO2 22 - 32 mmol/L 25  23  25    Calcium 8.9 - 10.3 mg/dL 8.9  8.7  8.8   Total Protein 6.5 - 8.1 g/dL 6.7     Total Bilirubin 0.3 - 1.2 mg/dL 0.3     Alkaline Phos 38 - 126 U/L 62     AST 15 - 41 U/L 23     ALT 0 - 44 U/L 16       Lipid Panel     Component Value Date/Time   CHOL 182 09/26/2021 1044   TRIG 48 09/26/2021 1044   HDL 54 09/26/2021 1044   CHOLHDL 3.5 04/07/2019 1005   LDLCALC 119 (H) 09/26/2021 1044    CBC    Component Value Date/Time   WBC 8.0 05/07/2022 2354   RBC 4.45 05/07/2022 2354   HGB 10.1 (L)  05/07/2022  2354   HGB 11.7 04/11/2020 1619   HCT 33.3 (L) 05/07/2022 2354   HCT 36.6 04/11/2020 1619   PLT 314 05/07/2022 2354   PLT 251 04/11/2020 1619   MCV 74.8 (L) 05/07/2022 2354   MCV 80 04/11/2020 1619   MCH 22.7 (L) 05/07/2022 2354   MCHC 30.3 05/07/2022 2354   RDW 18.2 (H) 05/07/2022 2354   RDW 16.4 (H) 04/11/2020 1619   LYMPHSABS 2.4 04/11/2020 1619   EOSABS 0.1 04/11/2020 1619   BASOSABS 0.1 04/11/2020 1619    Lab Results  Component Value Date   HGBA1C 5.9 06/08/2024   Lab Results  Component Value Date   HGBA1C 5.9 06/08/2024   HGBA1C 6.1 (H) 09/26/2021   HGBA1C 5.2 12/31/2017       1. Primary hypertension Uncontrolled due to medication nonadherence Chlorthalidone  refilled - LP+Non-HDL Cholesterol - CMP14+EGFR - CBC with Differential/Platelet - chlorthalidone  (HYGROTON ) 25 MG tablet; Take 1 tablet (25 mg total) by mouth daily.  Dispense: 90 tablet; Refill: 1  2.  Prediabetes Prediabetes with A1c of 5.9 Continue to work on lifestyle modification to prevent progression to type 2 diabetes mellitus - POCT glycosylated hemoglobin (Hb A1C)  3. Class 3 severe obesity due to excess calories without serious comorbidity with body mass index (BMI) of 40.0 to 44.9 in adult Counseled on reducing portion sizes and increasing exercise to aid in weight loss  4. Chronic pain of both knees Uncontrolled We have discussed possibility of cortisone injections which she is not open to - Ambulatory referral to Physical Therapy - meloxicam (MOBIC) 7.5 MG tablet; Take 1 tablet (7.5 mg total) by mouth daily.  Dispense: 30 tablet; Refill: 3 - DG Knee Complete 4 Views Right; Future - DG Knee Complete 4 Views Left; Future  5. Paresthesia Screening for diabetes is negative She could possibly have carpal tunnel syndrome especially since symptoms are noticed when she is braiding her children's hair. If symptoms persist will refer for nerve conduction study and Ortho evaluation for possible  corticosteroid injection - Vitamin B12 - gabapentin (NEURONTIN) 300 MG capsule; Take 1 capsule (300 mg total) by mouth at bedtime.  Dispense: 30 capsule; Refill: 3  Meds ordered this encounter  Medications   meloxicam (MOBIC) 7.5 MG tablet    Sig: Take 1 tablet (7.5 mg total) by mouth daily.    Dispense:  30 tablet    Refill:  3   gabapentin (NEURONTIN) 300 MG capsule    Sig: Take 1 capsule (300 mg total) by mouth at bedtime.    Dispense:  30 capsule    Refill:  3   chlorthalidone  (HYGROTON ) 25 MG tablet    Sig: Take 1 tablet (25 mg total) by mouth daily.    Dispense:  90 tablet    Refill:  1    Follow-up: Return in about 6 weeks (around 07/20/2024) for CPE/ Preventive Health Exam.       Corrina Sabin, MD, FAAFP. [facility] [place], [place] [phone]   06/08/2024, 5:06 PM
